# Patient Record
Sex: Female | Born: 1985 | Race: White | Hispanic: No | Marital: Single | Smoking: Current every day smoker
Health system: Southern US, Community
[De-identification: ages and names within clinical notes are randomized; demographics above are authoritative.]

## PROBLEM LIST (undated history)

## (undated) DIAGNOSIS — F419 Anxiety disorder, unspecified: Secondary | ICD-10-CM

## (undated) DIAGNOSIS — R011 Cardiac murmur, unspecified: Secondary | ICD-10-CM

## (undated) HISTORY — DX: Cardiac murmur, unspecified: R01.1

## (undated) HISTORY — PX: WISDOM TOOTH EXTRACTION: SHX21

## (undated) HISTORY — DX: Anxiety disorder, unspecified: F41.9

---
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
## 2006-06-05 ENCOUNTER — Other Ambulatory Visit: Admission: RE | Admit: 2006-06-05 | Discharge: 2006-06-05 | Payer: Self-pay | Admitting: Family Medicine

## 2007-11-08 ENCOUNTER — Other Ambulatory Visit: Admission: RE | Admit: 2007-11-08 | Discharge: 2007-11-08 | Payer: Self-pay | Admitting: Family Medicine

## 2008-11-26 ENCOUNTER — Other Ambulatory Visit: Admission: RE | Admit: 2008-11-26 | Discharge: 2008-11-26 | Payer: Self-pay | Admitting: Family Medicine

## 2009-01-15 ENCOUNTER — Encounter: Admission: RE | Admit: 2009-01-15 | Discharge: 2009-01-15 | Payer: Self-pay | Admitting: Occupational Medicine

## 2009-06-21 ENCOUNTER — Encounter: Admission: RE | Admit: 2009-06-21 | Discharge: 2009-06-21 | Payer: Self-pay | Admitting: Unknown Physician Specialty

## 2010-01-04 ENCOUNTER — Other Ambulatory Visit: Admission: RE | Admit: 2010-01-04 | Discharge: 2010-01-04 | Payer: Self-pay | Admitting: Family Medicine

## 2010-06-22 ENCOUNTER — Emergency Department (HOSPITAL_COMMUNITY): Admission: EM | Admit: 2010-06-22 | Discharge: 2010-06-22 | Payer: Self-pay | Admitting: Emergency Medicine

## 2011-01-25 ENCOUNTER — Other Ambulatory Visit (HOSPITAL_COMMUNITY)
Admission: RE | Admit: 2011-01-25 | Discharge: 2011-01-25 | Disposition: A | Discharge status: Home / Self Care | Payer: 59 | Source: Ambulatory Visit | Attending: Family Medicine | Admitting: Family Medicine

## 2011-01-25 DIAGNOSIS — Z124 Encounter for screening for malignant neoplasm of cervix: Secondary | ICD-10-CM | POA: Insufficient documentation

## 2012-06-17 ENCOUNTER — Emergency Department (HOSPITAL_COMMUNITY): Payer: PRIVATE HEALTH INSURANCE

## 2012-06-17 ENCOUNTER — Encounter (HOSPITAL_COMMUNITY): Payer: Self-pay | Admitting: *Deleted

## 2012-06-17 ENCOUNTER — Emergency Department (HOSPITAL_COMMUNITY)
Admission: EM | Admit: 2012-06-17 | Discharge: 2012-06-17 | Disposition: A | Discharge status: Home / Self Care | Payer: PRIVATE HEALTH INSURANCE | Attending: Emergency Medicine | Admitting: Emergency Medicine

## 2012-06-17 DIAGNOSIS — Z880 Allergy status to penicillin: Secondary | ICD-10-CM | POA: Insufficient documentation

## 2012-06-17 DIAGNOSIS — M25519 Pain in unspecified shoulder: Secondary | ICD-10-CM | POA: Insufficient documentation

## 2012-06-17 DIAGNOSIS — F17200 Nicotine dependence, unspecified, uncomplicated: Secondary | ICD-10-CM | POA: Insufficient documentation

## 2012-06-17 MED ORDER — HYDROCODONE-ACETAMINOPHEN 5-325 MG PO TABS: ORAL_TABLET | ORAL | Status: AC

## 2012-06-17 MED ORDER — NAPROXEN 500 MG PO TABS: 500.0000 mg | ORAL_TABLET | Freq: Two times a day (BID) | ORAL | Status: AC

## 2012-06-17 NOTE — ED Notes (Signed)
Pt reports having right shoulder pain, was at work when pain started, denies any known injury to ext.

## 2012-06-17 NOTE — ED Provider Notes (Signed)
History     CSN: [id number]  Arrival date & time 06/17/12  1522   First MD Initiated Contact with Patient 06/17/12 1606      Chief Complaint  Patient presents with  . Shoulder Pain    (Consider location/radiation/quality/duration/timing/severity/associated sxs/prior treatment) HPI Comments: Patient c/o pain and "popping" sensation to her right shoulder.  States she was at work and noticed her shoulder began "popping" when she moved it or picked up something.  Also c/o intermittent "tingling" sensation into her right arm.  She denies recent injury, neck pain, chest pain, shortness of breath or swelling.    Patient is a 26 y.o. female presenting with shoulder pain. The history is provided by the patient.  Shoulder Pain This is a new problem. Episode onset: several hours PTA. The problem occurs constantly. The problem has been unchanged. Associated symptoms include arthralgias and joint swelling. Pertinent negatives include no chest pain, chills, coughing, diaphoresis, fever, headaches, neck pain, numbness, visual change, vomiting or weakness. Associated symptoms comments: Tingling to her right arm. The symptoms are aggravated by bending (use, and movement). She has tried nothing for the symptoms. The treatment provided no relief.    History reviewed. No pertinent past medical history.  History reviewed. No pertinent past surgical history.  History reviewed. No pertinent family history.  History  Substance Use Topics  . Smoking status: Current Everyday Smoker  . Smokeless tobacco: Not on file  . Alcohol Use: No    OB History    Grav Para Term Preterm Abortions TAB SAB Ect Mult Living                  Review of Systems  Constitutional: Negative for fever, chills and diaphoresis.  HENT: Negative for neck pain and neck stiffness.   Respiratory: Negative for cough and chest tightness.   Cardiovascular: Negative for chest pain.  Gastrointestinal: Negative for vomiting.    Genitourinary: Negative for dysuria and difficulty urinating.  Musculoskeletal: Positive for joint swelling and arthralgias. Negative for back pain.  Skin: Negative for color change and wound.  Neurological: Negative for dizziness, weakness, numbness and headaches.  All other systems reviewed and are negative.    Allergies  Penicillins  Home Medications   Current Outpatient Rx  Name Route Sig Dispense Refill  . ASPIRIN-ACETAMINOPHEN-CAFFEINE 250-250-65 MG PO TABS Oral Take 2 tablets by mouth as needed. For pain    . NORETHINDRONE ACET-ETHINYL EST 1-20 MG-MCG PO TABS Oral Take 1 tablet by mouth every morning.      BP 142/76  Pulse 107  Temp 98.3 F (36.8 C) (Oral)  Resp 18  Ht 5' 7.5" (1.715 m)  Wt 155 lb (70.308 kg)  BMI 23.92 kg/m2  SpO2 98%  LMP 05/22/2012  Physical Exam  Nursing note and vitals reviewed. Constitutional: She is oriented to person, place, and time. She appears well-developed and well-nourished. No distress.  HENT:  Head: Normocephalic and atraumatic.  Neck: Normal range of motion. Neck supple. No spinous process tenderness and no muscular tenderness present. No rigidity. Normal range of motion present.  Cardiovascular: Normal rate, regular rhythm, normal heart sounds and intact distal pulses.   No murmur heard. Pulmonary/Chest: Effort normal and breath sounds normal. She exhibits no tenderness.  Musculoskeletal: She exhibits tenderness. She exhibits no edema.       Right shoulder: She exhibits tenderness, crepitus and pain. She exhibits normal range of motion, no swelling, no effusion, no deformity, no laceration, no spasm, normal pulse and normal strength.  Arms:      Radial pulse is brisk, sensation intact.  CR< 2 sec.  No bruising or deformity.  Patient has full ROM of the shoulder, elbow and neck.  Mild crepitus is present with abduction of the arm.    Lymphadenopathy:    She has no cervical adenopathy.  Neurological: She is alert and oriented  to person, place, and time. She exhibits normal muscle tone. Coordination normal.  Skin: Skin is warm and dry.    ED Course  Procedures (including critical care time)  Labs Reviewed - No data to display Dg Shoulder Right  06/17/2012  *RADIOLOGY REPORT*  Clinical Data: Shoulder pain.  No injury.  RIGHT SHOULDER - 2+ VIEW  Comparison: None.  Findings: Cervical rib on the right is incidentally noted. Visualized right chest appears normal.  Internal and external rotation views of the right shoulder are normal.  The AC joint is normal. Right shoulder is located.  IMPRESSION: Negative.  Original Report Authenticated By: Andreas Newport, M.D.    Sling applied by the nursing staff, pain improved,  Remains NV intact    MDM    Consulted Dr. Romeo Apple.  Recommends sling and close f/u with his office.  Patient agrees to care plan and verbalized understanding   The patient appears reasonably screened and/or stabilized for discharge and I doubt any other medical condition or other [facility] requiring further screening, evaluation, or treatment in the ED at this time prior to discharge.   Prescribed: norco #[id number] naprosyn       Uva Runkel L. Berkley Wrightsman, [place] 06/20/12 1610

## 2012-06-17 NOTE — ED Notes (Signed)
Rt shoulder pain, "feels like its popping in and out" Tingling at times.  No known injury

## 2012-06-21 NOTE — ED Provider Notes (Signed)
Medical screening examination/treatment/procedure(s) were performed by non-physician practitioner and as supervising physician I was immediately available for consultation/collaboration.  Athira Janowicz L Liliani Bobo, MD 06/21/12 0935 

## 2012-07-03 ENCOUNTER — Ambulatory Visit: Payer: PRIVATE HEALTH INSURANCE | Admitting: *Deleted

## 2012-07-10 ENCOUNTER — Ambulatory Visit (HOSPITAL_COMMUNITY)
Admission: RE | Admit: 2012-07-10 | Discharge: 2012-07-10 | Disposition: A | Discharge status: Home / Self Care | Payer: PRIVATE HEALTH INSURANCE | Source: Ambulatory Visit | Attending: Family Medicine | Admitting: Family Medicine

## 2012-07-10 DIAGNOSIS — IMO0001 Reserved for inherently not codable concepts without codable children: Secondary | ICD-10-CM | POA: Insufficient documentation

## 2012-07-10 DIAGNOSIS — M6281 Muscle weakness (generalized): Secondary | ICD-10-CM | POA: Insufficient documentation

## 2012-07-10 DIAGNOSIS — M25519 Pain in unspecified shoulder: Secondary | ICD-10-CM | POA: Insufficient documentation

## 2012-07-10 DIAGNOSIS — M7541 Impingement syndrome of right shoulder: Secondary | ICD-10-CM | POA: Insufficient documentation

## 2012-07-10 NOTE — Evaluation (Signed)
Occupational Therapy Evaluation  Patient Details  Name: Dana May MRN: [id number] Date of Birth: Oct 16, 1986  Today's Date: 07/10/2012 Time: 1525-1600 OT Time Calculation (min): 35 min OT Eval 15 Manual Therapy 15 Visit#: 1  of 12   Re-eval: 08/07/12  Assessment Diagnosis: RIght Shoulder Impingement Syndrome Next MD Visit: one month  Authorization: Workers Comp 12 visits  Authorization Time Period:    Authorization Visit#:   of     Past Medical History: No past medical history on file. Past Surgical History: No past surgical history on file.  Subjective S:  I was doing my normal routine at work on 06/17/12 and noticed alot of pain, popping, and tingling in my right shoulder and arm. Pertinent History: Ms. Dana May was working at the [employer] on 06/17/12 when she began to feel popping, pain, and tingling in her right shoulder region.  She consulted with occupational  health and was treated for shoulder impingement.  She followed up with Dr. Theresia Lo on 06/25/12 and recieved a cortisone injection.  She has been referred to occupational therapy for evaluation and treatment for 12 visits. Special Tests: UEFI 60/80 = 75% Patient Stated Goals: I want to use my arm without it feeling like this. 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 Leisure: Hobbies-yes (Comment)  Assessment ADL/Vision/Perception ADL ADL Comments: Ms. Dana May is having difficulty maintaining her grip on plates when she puts them into cabinets.  She has difficulty  maintaing her grip on patient's gait belts.  She was not able to squeeze the nozzle on the gas pump with her right hand.  She is not able to reach overhead comfortably. Dominant Hand: Right Vision - History Baseline Vision: No visual deficits Perception Perception: Within Functional Limits Praxis Praxis: Intact  Cognition/Observation Cognition Overall Cognitive Status: Appears within functional limits for tasks assessed  Sensation/Coordination/Edema Sensation Light Touch: Appears Intact Coordination Gross Motor Movements are Fluid and Coordinated: Yes Fine Motor Movements are Fluid and Coordinated: Yes Edema Edema: N/A  Additional Assessments RUE AROM (degrees) RUE Overall AROM Comments: Assessed in seated.  ER/IR with shoulder abducted Right Shoulder Flexion: 155 Degrees Right Shoulder ABduction: 155 Degrees Right Shoulder Internal Rotation: 75 Degrees Right Shoulder External Rotation: 80 Degrees RUE PROM (degrees) RUE Overall PROM Comments: WFL RUE Strength Right Shoulder Flexion: 5/5 Right Shoulder ABduction: 5/5 Right Shoulder Internal Rotation: 5/5 Right Shoulder External Rotation: 5/5 Grip (lbs): Right 56 pounds left 80 pounds Palpation Palpation: Moderate fascial restriction in right scapular region and upper arm.     Exercise/Treatments    Manual Therapy Manual Therapy: Myofascial release Myofascial Release: MFR and manual stretching to right shoulder to decrease pain and restrictions in her right shoulder region and promote pain free AROM.  Occupational Therapy Assessment and Plan OT Assessment and Plan Clinical Impression Statement: A:  Patient presents with increased pain and restrictions and decreased AROM and grip strength in her right arm.  Skilled OT is indicated to improve on these deficts for return to full use of her RUE with desired activities.   Pt will benefit from skilled therapeutic intervention in order to improve on the following deficits:  Decreased range of motion;Decreased strength;Increased fascial restricitons;Increased muscle spasms;Pain Rehab Potential: Excellent OT Frequency: Min 2X/week OT Duration: 6 weeks OT Treatment/Interventions: Self-care/ADL training;Therapeutic exercise;Therapeutic activities;Manual therapy;Patient/family education OT Plan:  Skilled OT is indicated to improve on these deficts for return to full use of her RUE with desired activities.  Teratment Plan:  MFR to right scapular region and upper arm.  Ther Ex for scapular stability and strengthening.     Goals Short Term Goals Time to Complete Short Term Goals: 3 weeks Short Term Goal 1: Patient will be educated on a HEP. Short Term Goal 2: Patient will increase right shoulder AROM by 10 for increased ability to reach overhead. Short Term Goal 3: Patient will increase right grip strength by 10 pounds for increased ability to maintain grasp on gait belts at work. 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 Short Term Goal 5: Patient will decrease fascial restrictions to minimal in her right shoulder. Long Term Goals Time to Complete Long Term Goals: 6 weeks Long Term Goal 1: Patient will return to prior level of I with all B/IADLs, work, and leisure activities. Long Term Goal 2: Patient will increase her right shoulder AROM to WNL for increased ability to reach into overhead cabinets. Long Term Goal 3: Patient will increase her right grip strength to 75 pounds or more for increased ability to squeeze the nozzle on gas pumps. Long Term Goal 4: Patient will decrease her pain level to 1/10 in her right shoulder region.   Long Term Goal 5: Patient will decrease fascial restrictions to trace in her right shoulder. Additional Long Term Goals?: Yes Long Term Goal 6: Patient will increase right scapular stability to normal.   Problem List Patient Active Problem List  Diagnosis  . Impingement  syndrome of right shoulder  . Pain in joint, shoulder region  . Muscle weakness (generalized)    End of Session Activity Tolerance: Patient tolerated treatment well General Behavior During Session: [facility] for tasks performed Cognition: [facility] for tasks performed OT Plan of Care OT Home Exercise Plan: tband for scapular stability and shoulder stretches.  Shirlean Mylar, OTR/L  07/10/2012, 5:16 PM  Physician Documentation Your signature is required to indicate approval of the treatment plan as stated above.  Please sign and either send electronically or make a copy of this report for your files and return this physician signed original.  Please mark one 1.__approve of plan  2. ___approve of plan with the following conditions.   ______________________________                                                          _____________________ Physician Signature  Date  

## 2012-07-17 ENCOUNTER — Ambulatory Visit (HOSPITAL_COMMUNITY)
Admission: RE | Admit: 2012-07-17 | Discharge: 2012-07-17 | Disposition: A | Discharge status: Home / Self Care | Payer: PRIVATE HEALTH INSURANCE | Source: Ambulatory Visit | Attending: Family Medicine | Admitting: Family Medicine

## 2012-07-17 DIAGNOSIS — M7541 Impingement syndrome of right shoulder: Secondary | ICD-10-CM

## 2012-07-17 DIAGNOSIS — IMO0001 Reserved for inherently not codable concepts without codable children: Secondary | ICD-10-CM | POA: Insufficient documentation

## 2012-07-17 DIAGNOSIS — M25619 Stiffness of unspecified shoulder, not elsewhere classified: Secondary | ICD-10-CM | POA: Insufficient documentation

## 2012-07-17 DIAGNOSIS — M25519 Pain in unspecified shoulder: Secondary | ICD-10-CM | POA: Insufficient documentation

## 2012-07-17 DIAGNOSIS — M6281 Muscle weakness (generalized): Secondary | ICD-10-CM | POA: Insufficient documentation

## 2012-07-17 NOTE — Progress Notes (Addendum)
Occupational Therapy Treatment Patient Details  Name: Dana May MRN: [id number] Date of Birth: [birth year]  Today's Date: 07/17/2012 Time: 4098-1191 OT Time Calculation (min): 56 min Manual Therapy: 478-295  33' Therapeutic Exercise: 349-411 23'  Visit#: 2  of 12   Re-eval: 08/07/12    Authorization: Workers Comp 12 visits  Authorization Time Period:    Authorization Visit#:   of    Subjective Symptoms/Limitations Symptoms: S:  My arm does not hurt but it tingles all the way down and I drop stuff. Pain Assessment Currently in Pain?: No/denies Pain Score: 0-No pain  Precautions/Restrictions   Shoulder Precautions  Exercise/Treatments Supine Protraction: PROM;AAROM;10 reps Horizontal ABduction: PROM;AAROM;10 reps External Rotation: PROM;AAROM;10 reps Internal Rotation: PROM;AAROM;10 reps Flexion: PROM;AAROM;10 reps ABduction: PROM;AAROM;10 reps    Standing External Rotation: Strengthening;15 reps Theraband Level (Shoulder External Rotation): Level 3 (Green) Internal Rotation: Strengthening;15 reps Theraband Level (Shoulder Internal Rotation): Level 3 (Green) Extension: Strengthening;15 reps Theraband Level (Shoulder Extension): Level 3 (Green) Row: Strengthening;15 reps Theraband Level (Shoulder Row): Level 3 (Green) Retraction: Strengthening;15 reps Theraband Level (Shoulder Retraction): Level 3 (Green)    Therapy Ball Flexion: 25 reps ABduction: 25 reps ROM / Strengthening / Isometric Strengthening UBE (Upper Arm Bike): 3' forward and backward 1.5       Manual Therapy Manual Therapy: Myofascial release Myofascial Release: MFR and manual stretching to right shoulder to decrease pain and restrictions in her right shoulder region and promote pain free AROM  Occupational Therapy Assessment and Plan OT Assessment and Plan Clinical Impression Statement: A: Patient showed full PROM and AROM today. Patient tolerated strengthening exercises well. Required  minimal tactile and vg cues to keep proper scapular positioning during therapy ball ABD Rehab Potential: Excellent OT Plan: P: Add V to X, and W exercises for increased ROM and strengthening. Transition from supine AAROM to prone ex for scapular strengthening. Add ball circles to therapy ball exercises.   Goals Short Term Goals Time to Complete Short Term Goals: 3 weeks Short Term Goal 1: Patient will be educated on a HEP. Short Term Goal 2: Patient will increase right shoulder AROM by 10 for increased ability to reach overhead. Short Term Goal 3: Patient will increase right grip strength by 10 pounds for increased ability to maintain grasp on gait belts at work. 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 Short Term Goal 5: Patient will decrease fascial restrictions to minimal in her right shoulder. Long Term Goals Time to Complete Long Term Goals: 6 weeks Long Term Goal 1: Patient will return to prior level of I with all B/IADLs, work, and leisure activities. Long Term Goal 2: Patient will increase her right shoulder AROM to WNL for increased ability to reach into overhead cabinets. Long Term Goal 3: Patient will increase her right grip strength to 75 pounds or more for increased ability to squeeze the nozzle on gas pumps. Long Term Goal 4: Patient will decrease her pain level to 1/10 in her right shoulder region.   Long Term Goal 5: Patient will decrease fascial restrictions to trace in her right shoulder. Additional Long Term Goals?: Yes Long Term Goal 6: Patient will increase right scapular stability to normal.   Problem List Patient Active Problem List  Diagnosis  . Impingement syndrome of right shoulder  . Pain in joint, shoulder region  . Muscle weakness (generalized)     GO   [name], OTAS  07/17/2012, 4:49 PM  Note reviewed by clinical instructor and accurately reflects treatment session.  Mariann Palo L.  Noralee Stain, COTA/L

## 2012-07-19 ENCOUNTER — Ambulatory Visit (HOSPITAL_COMMUNITY): Payer: Self-pay | Admitting: Occupational Therapy

## 2012-07-23 ENCOUNTER — Ambulatory Visit (HOSPITAL_COMMUNITY)
Admission: RE | Admit: 2012-07-23 | Discharge: 2012-07-23 | Disposition: A | Discharge status: Home / Self Care | Payer: PRIVATE HEALTH INSURANCE | Source: Ambulatory Visit | Attending: Family Medicine | Admitting: Family Medicine

## 2012-07-23 DIAGNOSIS — M7541 Impingement syndrome of right shoulder: Secondary | ICD-10-CM

## 2012-07-23 DIAGNOSIS — M6281 Muscle weakness (generalized): Secondary | ICD-10-CM

## 2012-07-23 DIAGNOSIS — M25519 Pain in unspecified shoulder: Secondary | ICD-10-CM

## 2012-07-23 NOTE — Progress Notes (Signed)
Note reviewed by clinical instructor and accurately reflects treatment session.  Nicolaas Savo L. Latronda Spink, COTA/L  

## 2012-07-23 NOTE — Progress Notes (Signed)
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 Pain Assessment Currently in Pain?: Yes Pain Score:   4 Pain Location: Shoulder  Precautions/Restrictions     Exercise/Treatments Supine Protraction: PROM;10 reps Horizontal ABduction: PROM;10 reps External Rotation: PROM;10 reps Internal Rotation: PROM;10 reps Flexion: PROM;10 reps ABduction: PROM;10 reps  Prone  Retraction: AROM;10 reps Flexion: AROM;10 reps Extension: AROM;10 reps External Rotation: AROM;10 reps Internal Rotation: AROM;10 reps Horizontal ABduction 1: AROM;10 reps Horizontal ABduction 2: AROM;10 reps    Standing External Rotation: Strengthening;20 reps Theraband Level (Shoulder External Rotation): Level 3 (Green) Internal Rotation: Strengthening;20 reps Theraband Level (Shoulder Internal Rotation): Level 3 (Green) Flexion: Strengthening;20 reps Theraband Level (Shoulder Flexion): Level 3 (Green) ABduction: Strengthening;20 reps Theraband Level (Shoulder ABduction): Level 3 (Green) Extension: Strengthening;20 reps Theraband Level (Shoulder Extension): Level 3 (Green) Row: Strengthening;20 reps Theraband Level (Shoulder Row): Level 3 (Green) Retraction: Strengthening;20 reps Theraband Level (Shoulder Retraction): Level 3 (Green)    Therapy Ball Flexion: 25 reps ABduction: 25 reps Right/Left: 5 reps ROM / Strengthening / Isometric Strengthening UBE (Upper Arm Bike): 3' forward and backward 1.5 "W" Arms: 12 reps X to V  Arms: 12 reps           Manual Therapy Manual Therapy: Myofascial release Myofascial Release: MFR and manual stretching to right shoulder, scapula and upper trap to decrease pain and restrictions to promote pain free AROM to allow to be pain free with work and leisure skills. Completed by Theophilus Bones, COTA/L  [phone]   Occupational Therapy Assessment and Plan OT Assessment and Plan Clinical Impression Statement: A: Pt showed improved scapula positioning on therapy ball exercises. Added x to v, and w exercises, patient tolerated well.  Increased thband reps. Patient tolerated well and with correct form. Discharge thband to HEP.  Clinical Impairments Affecting Rehab Potential:  P: Add proximal stability exercises. Add ball on wall, thumb tacks, and elevation/depression/ retraction for increased strength and endurance.     Goals Short Term Goals Time to Complete Short Term Goals: 3 weeks Short Term Goal 1: Patient will be educated on a HEP. Short Term Goal 1 Progress: Met Short Term Goal 2: Patient will increase right shoulder AROM by 10 for increased ability to reach overhead. Short Term Goal 2 Progress: Met Short Term Goal 3: Patient will increase right grip strength by 10 pounds for increased ability to maintain grasp on gait belts at work. 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 Short Term Goal 4 Progress: Met Short Term Goal 5: Patient will decrease fascial restrictions to minimal in her right shoulder. Short Term Goal 5 Progress: Progressing toward goal Long Term Goals Time to Complete Long Term Goals: 6 weeks Long Term Goal 1: Patient will return to prior level of I with all B/IADLs, work, and leisure activities. Long Term Goal 1 Progress: Progressing toward goal Long Term Goal 2: Patient will increase her  right shoulder AROM to WNL for increased ability to  reach into overhead cabinets. Long Term Goal 2 Progress: Met Long Term Goal 3: Patient will increase her right grip strength to 75 pounds or more for increased ability to squeeze the nozzle on gas pumps. Long Term Goal 4: Patient will decrease her pain level to 1/10 in her right shoulder region.   Long Term Goal 4 Progress: Progressing toward goal Long Term Goal 5: Patient will decrease fascial restrictions to trace in her right shoulder. Long Term Goal 5 Progress: Progressing toward goal Additional Long Term Goals?: Yes Long Term Goal 6: Patient will increase right scapular stability to normal.  Long Term Goal 6 Progress: Progressing toward goal  Problem List Patient Active Problem List  Diagnosis  . Impingement syndrome of right shoulder  . Pain in joint, shoulder region  . Muscle weakness (generalized)    End of Session Activity Tolerance: Patient tolerated treatment well General Behavior During Session: [facility] for tasks performed Cognition: [facility] for tasks performed     [name], OTAS  07/23/2012, 5:04 PM

## 2012-07-25 ENCOUNTER — Ambulatory Visit (HOSPITAL_COMMUNITY)
Admission: RE | Admit: 2012-07-25 | Discharge: 2012-07-25 | Disposition: A | Discharge status: Home / Self Care | Payer: PRIVATE HEALTH INSURANCE | Source: Ambulatory Visit | Attending: Family Medicine | Admitting: Family Medicine

## 2012-07-25 DIAGNOSIS — M25519 Pain in unspecified shoulder: Secondary | ICD-10-CM

## 2012-07-25 DIAGNOSIS — M7541 Impingement syndrome of right shoulder: Secondary | ICD-10-CM

## 2012-07-25 DIAGNOSIS — M6281 Muscle weakness (generalized): Secondary | ICD-10-CM

## 2012-07-25 NOTE — Progress Notes (Signed)
Occupational Therapy Treatment Patient Details  Name: Dana May MRN: [id number] Date of Birth: 1986-08-06  Today's Date: 07/25/2012 Time: 4098-1191 OT Time Calculation (min): 51 min Manual Therapy: 4782-9562 21' Therapeutic Exercise: 1308-6578 30'  Visit#: 4  of 12   Re-eval: 08/07/12    Authorization: Workers Comp 12 visits  Authorization Time Period:    Authorization Visit#:   of    Subjective Symptoms/Limitations Symptoms: S: I can't get comfortable when I sleep.  Pain Assessment Currently in Pain?: Yes Pain Score:   2 Pain Location: Shoulder Pain Orientation: Right;Anterior;Lateral Pain Type: Acute pain  Precautions/Restrictions   n/a  Exercise/Treatments Supine Protraction: PROM;10 reps Horizontal ABduction: PROM;10 reps External Rotation: PROM;10 reps Internal Rotation: PROM;10 reps Flexion: PROM;10 reps ABduction: PROM;10 reps    Prone  Retraction: AROM;12 reps Flexion: AROM;12 reps Extension: AROM;12 reps External Rotation: AROM;12 reps Internal Rotation: AROM;12 reps Horizontal ABduction 1: AROM;12 reps Horizontal ABduction 2: AROM;12 reps       Therapy Ball Flexion: 25 reps ABduction: 25 reps Right/Left: 5 reps ROM / Strengthening / Isometric Strengthening UBE (Upper Arm Bike): 3' forward 3' backward 3.5 "W" Arms: 12 reps (2# wt) X to V Arms: 12 reps (2# wt) Proximal Shoulder Strengthening, Seated: 3x10 w/ 2# Ball on Wall: 1' Prot/Ret//Elev/Dep: 1' Other ROM/Strengthening Exercises: scapular depression with 2# wt         Manual Therapy Manual Therapy: Myofascial release Myofascial Release: MFR and manual stretching to right shoulder, scapula and upper trap to decrease pain and restrictions to promote pain free AROM to allow to be pain free with work and leisure skills  Occupational Therapy Assessment and Plan OT Assessment and Plan Clinical Impression Statement: A: Added wt to all seated exercises performed; patient tolerated  well; became fatigued by the end of seated exercises using 2# wt. Added wall exercises; ball on wall and elev/dep/ret/pro. Pt required min tactile cues to perform dep/ret exercise Rehab Potential: Excellent  OT Plan: P: Increase reps in seated exercises. Increase time on wall exercises. Provide pt with visual print outs for muscle structure to explain benefits of therapeutic exercises to decrease pain and increase ROM and ADL performance. taken at 07/25/12 1624 by [name], Student-OT   Goals Short Term Goals Time to Complete Short Term Goals: 3 weeks Short Term Goal 1: Patient will be educated on a HEP. Short Term Goal 2: Patient will increase right shoulder AROM by 10 for increased ability to reach overhead. Short Term Goal 3: Patient will increase right grip strength by 10 pounds for increased ability to maintain grasp on gait belts at work. 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 Short Term Goal 5: Patient will decrease fascial restrictions to minimal in her right shoulder. Long Term Goals Time to Complete Long Term Goals: 6 weeks Long Term Goal 1: Patient will return to prior level of I with all B/IADLs, work, and leisure activities. Long Term Goal 2: Patient will increase her right shoulder AROM to WNL for increased ability to reach into overhead cabinets. Long Term Goal 3: Patient will increase her right grip strength to 75 pounds or more for increased ability to squeeze the nozzle on gas pumps. Long Term Goal 4: Patient will decrease her pain level to 1/10 in her right shoulder region.   Long Term Goal 5: Patient will decrease fascial restrictions to trace in her right shoulder. Additional Long Term Goals?: Yes Long Term Goal 6:  Patient will increase right scapular stability to normal.   Problem List Patient Active Problem List  Diagnosis  . Impingement syndrome of right shoulder  . Pain in joint, shoulder  region  . Muscle weakness (generalized)    End of Session Activity Tolerance: Patient tolerated treatment well General Behavior During Session: [facility] for tasks performed Cognition: [facility] for tasks performed  GO   [name], OTAS  07/25/2012, 4:36 PM

## 2012-07-25 NOTE — Progress Notes (Signed)
Note reviewed by clinical instructor and accurately reflects treatment session.  Bethany H. Murray, OTR/L  

## 2012-07-31 ENCOUNTER — Ambulatory Visit (HOSPITAL_COMMUNITY)
Admission: RE | Admit: 2012-07-31 | Discharge: 2012-07-31 | Disposition: A | Discharge status: Home / Self Care | Payer: PRIVATE HEALTH INSURANCE | Source: Ambulatory Visit | Attending: Family Medicine | Admitting: Family Medicine

## 2012-07-31 DIAGNOSIS — M25519 Pain in unspecified shoulder: Secondary | ICD-10-CM

## 2012-07-31 DIAGNOSIS — M6281 Muscle weakness (generalized): Secondary | ICD-10-CM

## 2012-07-31 DIAGNOSIS — M7541 Impingement syndrome of right shoulder: Secondary | ICD-10-CM

## 2012-07-31 NOTE — Progress Notes (Signed)
Occupational Therapy Treatment Patient Details  Name: Dana May MRN: [id number] Date of Birth: Jul 30, 1986  Today's Date: 07/31/2012 Time: 4098-1191 OT Time Calculation (min): 44 min Manual Therapy 478-295 17' Therapeutic Exercise 204-230 26'  Visit#: 5  of 12   Re-eval: 08/07/12    Authorization: Workers Comp 12 visits    Subjective Symptoms/Limitations Symptoms: S:  Since the weather has changed i feel tight at night. Pain Assessment Currently in Pain?: No/denies Pain Score: 0-No pain  Precautions/Restrictions     Exercise/Treatments Supine Protraction: PROM;10 reps Horizontal ABduction: PROM;10 reps External Rotation: PROM;10 reps Internal Rotation: PROM;10 reps Flexion: PROM;10 reps ABduction: PROM;10 reps Seated Protraction: Strengthening;10 reps Horizontal ABduction: Strengthening;10 reps External Rotation: Strengthening;10 reps Internal Rotation: Strengthening;10 reps Flexion: Strengthening;10 reps Abduction: Strengthening;10 reps Prone  Retraction: AROM;15 reps Flexion: AROM;15 reps Extension: AROM;15 reps External Rotation: AROM;15 reps Internal Rotation: AROM;15 reps Horizontal ABduction 1: AROM;15 reps Horizontal ABduction 2: AROM;15 reps Therapy Ball Flexion:  (d/c) ABduction:  (d/c) Right/Left: 5 reps ROM / Strengthening / Isometric Strengthening UBE (Upper Arm Bike): 3' forward 3' backward 4.0 "W" Arms: 15 reps with 2# X to V Arms: 15 reps with 2# Proximal Shoulder Strengthening, Seated: x 20 with no weight or rests Ball on Wall: 2' with green ball Prot/Ret//Elev/Dep: 1' Other ROM/Strengthening Exercises: scapular depression with green band       Manual Therapy Manual Therapy: Myofascial release Myofascial Release: MFR and manual stretching to right shoulder, scapula and upper trap to decrease pain and restrictions to promote pain free AROM to allow to be pain free with work and leisure skills  Occupational Therapy Assessment and  Plan OT Assessment and Plan Clinical Impression Statement: A:  Added seated strengthening which fatigued patient but she completed without any increase in pain. Also add scapular depression with green band to increase scapular stability. Rehab Potential: Excellent OT Plan: P:  Increase reps with seated strengthening.  Attempt wt's with prone ex to increase scapular strength.   Goals Short Term Goals Time to Complete Short Term Goals: 3 weeks Short Term Goal 1: Patient will be educated on a HEP. Short Term Goal 2: Patient will increase right shoulder AROM by 10 for increased ability to reach overhead. Short Term Goal 3: Patient will increase right grip strength by 10 pounds for increased ability to maintain grasp on gait belts at work. 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 Short Term Goal 5: Patient will decrease fascial restrictions to minimal in her right shoulder. Long Term Goals Time to Complete Long Term Goals: 6 weeks Long Term Goal 1: Patient will return to prior level of I with all B/IADLs, work, and leisure activities. Long Term Goal 2: Patient will increase her right shoulder AROM to WNL for increased ability to reach into overhead cabinets. Long Term Goal 3: Patient will increase her right grip strength to 75 pounds or more for increased ability to squeeze the nozzle on gas pumps. Long Term Goal 4: Patient will decrease her pain level to 1/10 in her right shoulder region.   Long Term Goal 5: Patient will decrease fascial restrictions to trace in her right shoulder. Additional Long Term Goals?: Yes Long Term Goal 6: Patient will increase right scapular stability to normal.   Problem List Patient Active Problem List  Diagnosis  . Impingement syndrome of right shoulder  . Pain in joint, shoulder region  . Muscle weakness (generalized)    End  of Session Activity Tolerance: Patient tolerated treatment  well General Behavior During Session: [facility] for tasks performed Cognition: [facility] for tasks performed  GO    Marquise Lambson L. Cortana Vanderford, COTA/L  07/31/2012, 4:08 PM

## 2012-08-02 ENCOUNTER — Ambulatory Visit (HOSPITAL_COMMUNITY)
Admission: RE | Admit: 2012-08-02 | Discharge: 2012-08-02 | Disposition: A | Discharge status: Home / Self Care | Payer: PRIVATE HEALTH INSURANCE | Source: Ambulatory Visit | Attending: Family Medicine | Admitting: Family Medicine

## 2012-08-02 DIAGNOSIS — M6281 Muscle weakness (generalized): Secondary | ICD-10-CM

## 2012-08-02 DIAGNOSIS — M7541 Impingement syndrome of right shoulder: Secondary | ICD-10-CM

## 2012-08-02 DIAGNOSIS — M25519 Pain in unspecified shoulder: Secondary | ICD-10-CM

## 2012-08-02 NOTE — Progress Notes (Signed)
Occupational Therapy Treatment Patient Details  Name: Dana May MRN: [id number] Date of Birth: 1986/06/14  Today's Date: 08/02/2012 Time: 4098-1191 OT Time Calculation (min): 49 min Manual Therapy: 236-254 18' Therapeutic Exercise: 254-225 31'  Visit#: 6  of 12   Re-eval: 08/07/12    Authorization: Workers Comp 12 visits  Authorization Time Period:    Authorization Visit#:   of    Subjective Symptoms/Limitations Symptoms: S: Im not in any pain at all. I havent done anything all week, Ive been on vacation from work.  Pain Assessment Pain Score: 0-No pain  Precautions/Restrictions   Shoulder  Exercise/Treatments Supine Protraction: PROM;10 reps Horizontal ABduction: PROM;10 reps External Rotation: PROM;10 reps Internal Rotation: PROM;10 reps Flexion: PROM;10 reps ABduction: PROM;10 reps    Prone  Retraction: Strengthening;10 reps (2#) Flexion: Strengthening;10 reps (2#) Extension: Strengthening;10 reps (2#) External Rotation: Strengthening;10 reps (2#) Internal Rotation: Strengthening;10 reps (2#) Horizontal ABduction 1: Strengthening;10 reps (2#) Horizontal ABduction 2: Strengthening;10 reps (2#) Therapy Ball Right/Left: 10 reps ROM / Strengthening / Isometric Strengthening UBE (Upper Arm Bike): 3' forward 3' backward 5.0 "W" Arms: 20 with 2# X to V Arms: 20 with 2# Proximal Shoulder Strengthening, Seated: 3 x10 with 1# wt Ball on Wall: 2' flexion with green ball 2' abd w/gr ball Graduated Retraction with Theraband: x3 with green band Sustained Retraction with Theraband: x3 with green band  Other ROM/Strengthening Exercises: scapular depression with green band x20          Manual Therapy Manual Therapy: Myofascial release Myofascial Release: MFR and manual stretching to right shoulder, scapula and upper trap to decrease pain and restrictions to promote pain free AROM to allow to be pain free with work and leisure skills   Occupational Therapy  Assessment and Plan OT Assessment and Plan Clinical Impression Statement: A: Pt had full ROM and less restrictions. Pt stated that due to her not working in a few days she feels like the pain is gone. Pt toleratd added reps and wt to seated exercises. Added sustained thband and graduated thband. Pt tolerated well.  Clinical Impairments Affecting Rehab Potential: P: Discuss proper body mechanics while working if restrictions and fatigue have increase due to workload. Add work simulation to discuss and educate on Psychiatrist and energy concervation. Attempt  prone ball exercises to strengthen and stabilize scapula.    Goals Short Term Goals Time to Complete Short Term Goals: 3 weeks Short Term Goal 1: Patient will be educated on a HEP. Short Term Goal 2: Patient will increase right shoulder AROM by 10 for increased ability to reach overhead. Short Term Goal 3: Patient will increase right grip strength by 10 pounds for increased ability to maintain grasp on gait belts at work. 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 Short Term Goal 5: Patient will decrease fascial restrictions to minimal in her right shoulder. Long Term Goals Time to Complete Long Term Goals: 6 weeks Long Term Goal 1: Patient will return to prior level of I with all B/IADLs, work, and leisure activities. Long Term Goal 2: Patient will increase her right shoulder AROM to WNL for increased ability to reach into overhead cabinets. Long Term Goal 3: Patient will increase her right grip strength to 75 pounds or more for increased ability to squeeze the nozzle on gas pumps. Long Term Goal 4: Patient will decrease her pain level to 1/10 in her right shoulder region.   Long Term  Goal 5: Patient will decrease fascial restrictions to trace in her right shoulder. Additional Long Term Goals?: Yes Long Term Goal 6: Patient will increase right scapular stability to  normal.   Problem List Patient Active Problem List  Diagnosis  . Impingement syndrome of right shoulder  . Pain in joint, shoulder region  . Muscle weakness (generalized)    End of Session Activity Tolerance: Patient tolerated treatment well General Behavior During Session: [facility] for tasks performed Cognition: [facility] for tasks performed  GO    Noralee Stain, Mannie Ohlin L 08/02/2012, 3:33 PM

## 2012-08-06 ENCOUNTER — Ambulatory Visit (HOSPITAL_COMMUNITY)
Admission: RE | Admit: 2012-08-06 | Discharge: 2012-08-06 | Disposition: A | Discharge status: Home / Self Care | Payer: PRIVATE HEALTH INSURANCE | Source: Ambulatory Visit | Attending: Family Medicine | Admitting: Family Medicine

## 2012-08-06 DIAGNOSIS — M6281 Muscle weakness (generalized): Secondary | ICD-10-CM

## 2012-08-06 DIAGNOSIS — M25519 Pain in unspecified shoulder: Secondary | ICD-10-CM

## 2012-08-06 DIAGNOSIS — M7541 Impingement syndrome of right shoulder: Secondary | ICD-10-CM

## 2012-08-06 NOTE — Progress Notes (Signed)
Occupational Therapy Treatment Patient Details  Name: Dana May MRN: [id number] Date of Birth: 1986-03-20  Today's Date: 08/06/2012 Time: 4098-1191 OT Time Calculation (min): 10 min Reassessment 10'  Visit#: 7  of 12   Re-eval: 08/07/12    Authorization: Workers Comp 12 visits   Subjective S:  I am doing much better.  I have some tightness between my shoulder blades, but thats it.  Special Tests: UEFI was 75%, currently is 99% Pain Assessment Currently in Pain?: No/denies Pain Score: 0-No pain Precautions/Restrictions   N/A  Exercise/Treatments Reassessment completed this date.      Occupational Therapy Assessment and Plan OT Assessment and Plan Clinical Impression Statement: A:  Please refer to dc summary.  Patient has met all OT goals, is satisfied with her current functional level and is dc from OT services.   OT Plan: P:  DC this date.     Goals Short Term Goals Time to Complete Short Term Goals: 3 weeks Short Term Goal 1: Patient will be educated on a HEP. Short Term Goal 1 Progress: Met Short Term Goal 2: Patient will increase right shoulder AROM by 10 for increased ability to reach overhead. Short Term Goal 2 Progress: Met Short Term Goal 3: Patient will increase right grip strength by 10 pounds for increased ability to maintain grasp on gait belts at work. Short Term Goal 3 Progress: Met Short Term Goal 4: Patient will decrease pain to 3/10 in her right shoulder region while assisting patients. Short Term Goal 4 Progress: Met Short Term Goal 5: Patient will decrease fascial restrictions to minimal in her right shoulder. Short Term Goal 5 Progress: Met Long Term Goals Time to Complete Long Term Goals: 6 weeks Long Term Goal 1: Patient will return to prior level of I with all B/IADLs, work, and leisure activities. Long Term Goal 1 Progress: Met Long Term Goal 2: Patient will increase her right shoulder AROM to WNL for increased ability to reach into overhead  cabinets. Long Term Goal 2 Progress: Met Long Term Goal 3: Patient will increase her right grip strength to 75 pounds or more for increased ability to squeeze the nozzle on gas pumps. Long Term Goal 3 Progress: Met Long Term Goal 4: Patient will decrease her pain level to 1/10 in her right shoulder region.   Long Term Goal 4 Progress: Met Long Term Goal 5: Patient will decrease fascial restrictions to trace in her right shoulder. Long Term Goal 5 Progress: Met Additional Long Term Goals?: Yes Long Term Goal 6: Patient will increase right scapular stability to normal.  Long Term Goal 6 Progress: Met  Problem List Patient Active Problem List  Diagnosis  . Impingement syndrome of right shoulder  . Pain in joint, shoulder region  . Muscle weakness (generalized)    End of Session Activity Tolerance: Patient tolerated treatment well General Behavior During Session: [facility] for tasks performed Cognition: [facility] for tasks performed OT Plan of Care OT Home Exercise Plan: Reviewed HEP.   GO    Shirlean Mylar, OTR/L  08/06/2012, 4:11 PM

## 2012-08-09 ENCOUNTER — Ambulatory Visit (HOSPITAL_COMMUNITY): Payer: Self-pay | Admitting: Occupational Therapy

## 2012-08-13 ENCOUNTER — Ambulatory Visit (HOSPITAL_COMMUNITY): Payer: Self-pay | Admitting: Occupational Therapy

## 2012-08-16 ENCOUNTER — Ambulatory Visit (HOSPITAL_COMMUNITY): Payer: Self-pay | Admitting: Specialist

## 2012-08-21 ENCOUNTER — Ambulatory Visit (HOSPITAL_COMMUNITY): Payer: Self-pay | Admitting: Specialist

## 2014-07-12 IMAGING — CR DG SHOULDER 2+V*R*
3 series · 3 of 3 positions shown · non-contrast
Comparison: None.

CLINICAL DATA: Shoulder pain.  No injury.

RIGHT SHOULDER - 2+ VIEW

[view not recorded (1 of 3)]
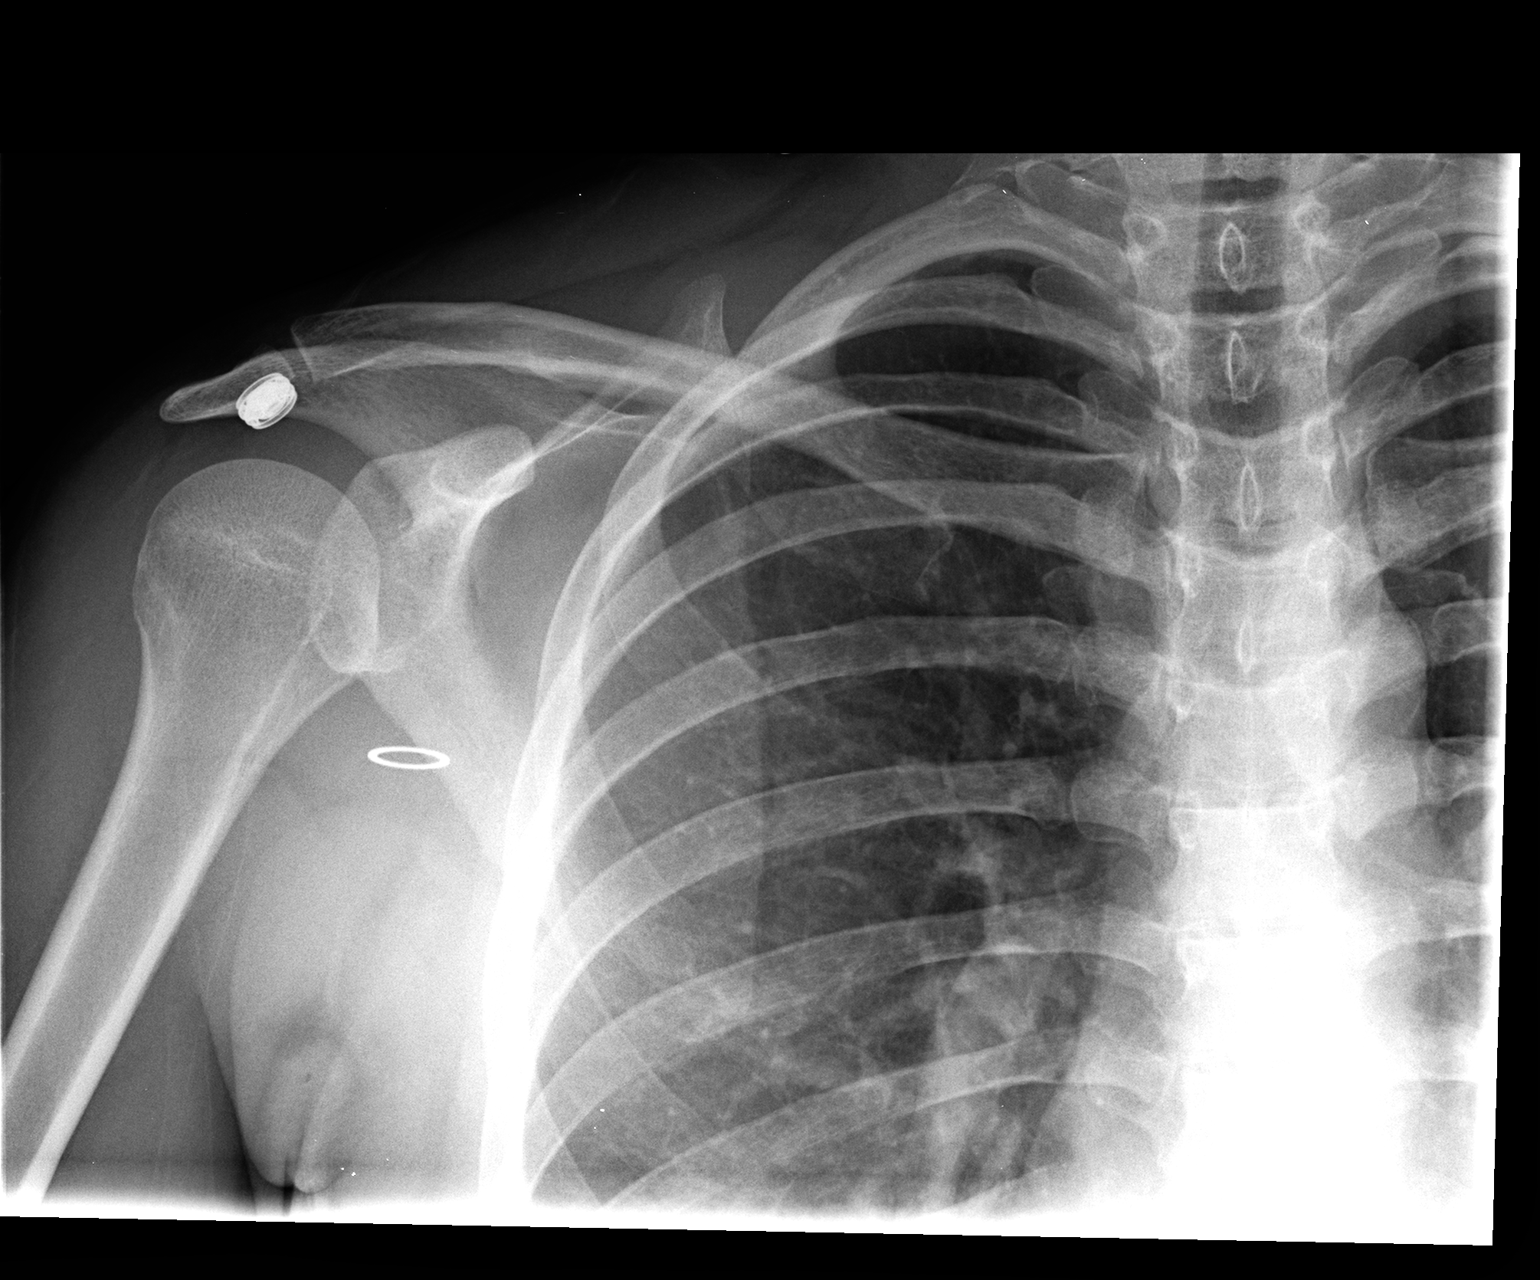

[view not recorded (2 of 3)]
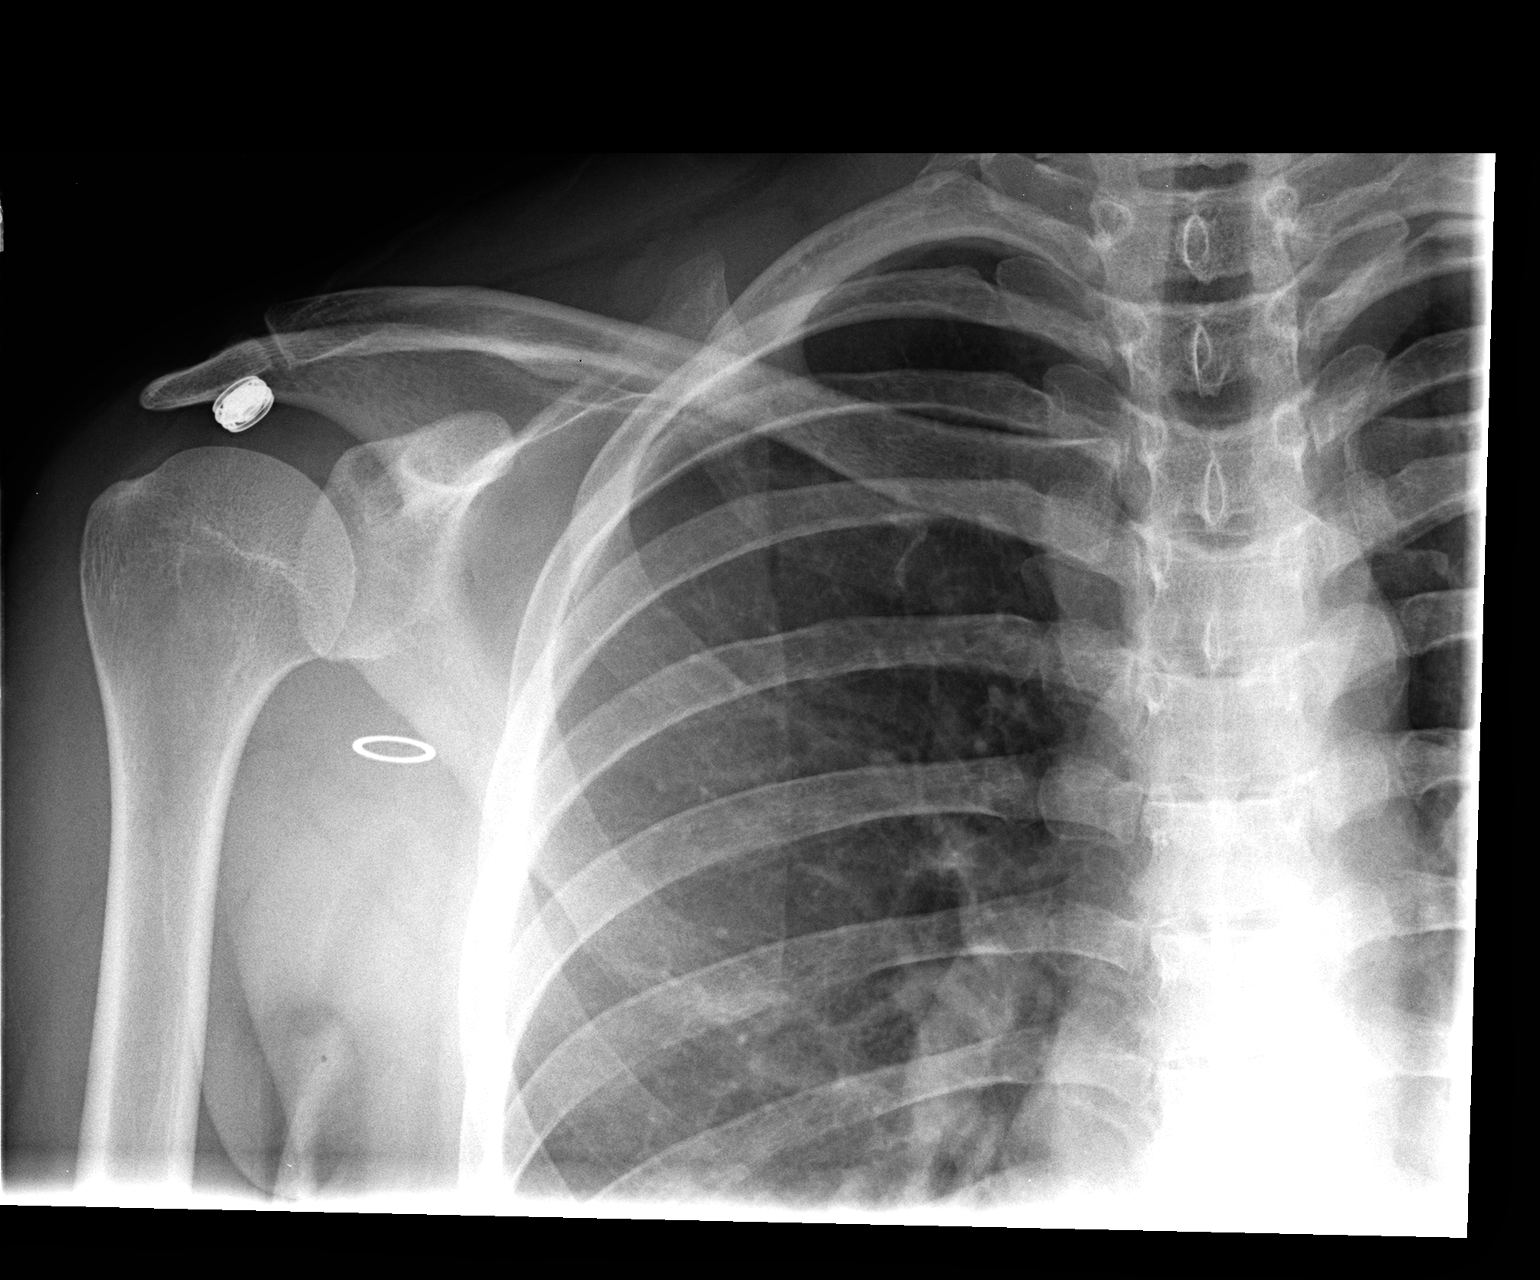

[view not recorded (3 of 3)]
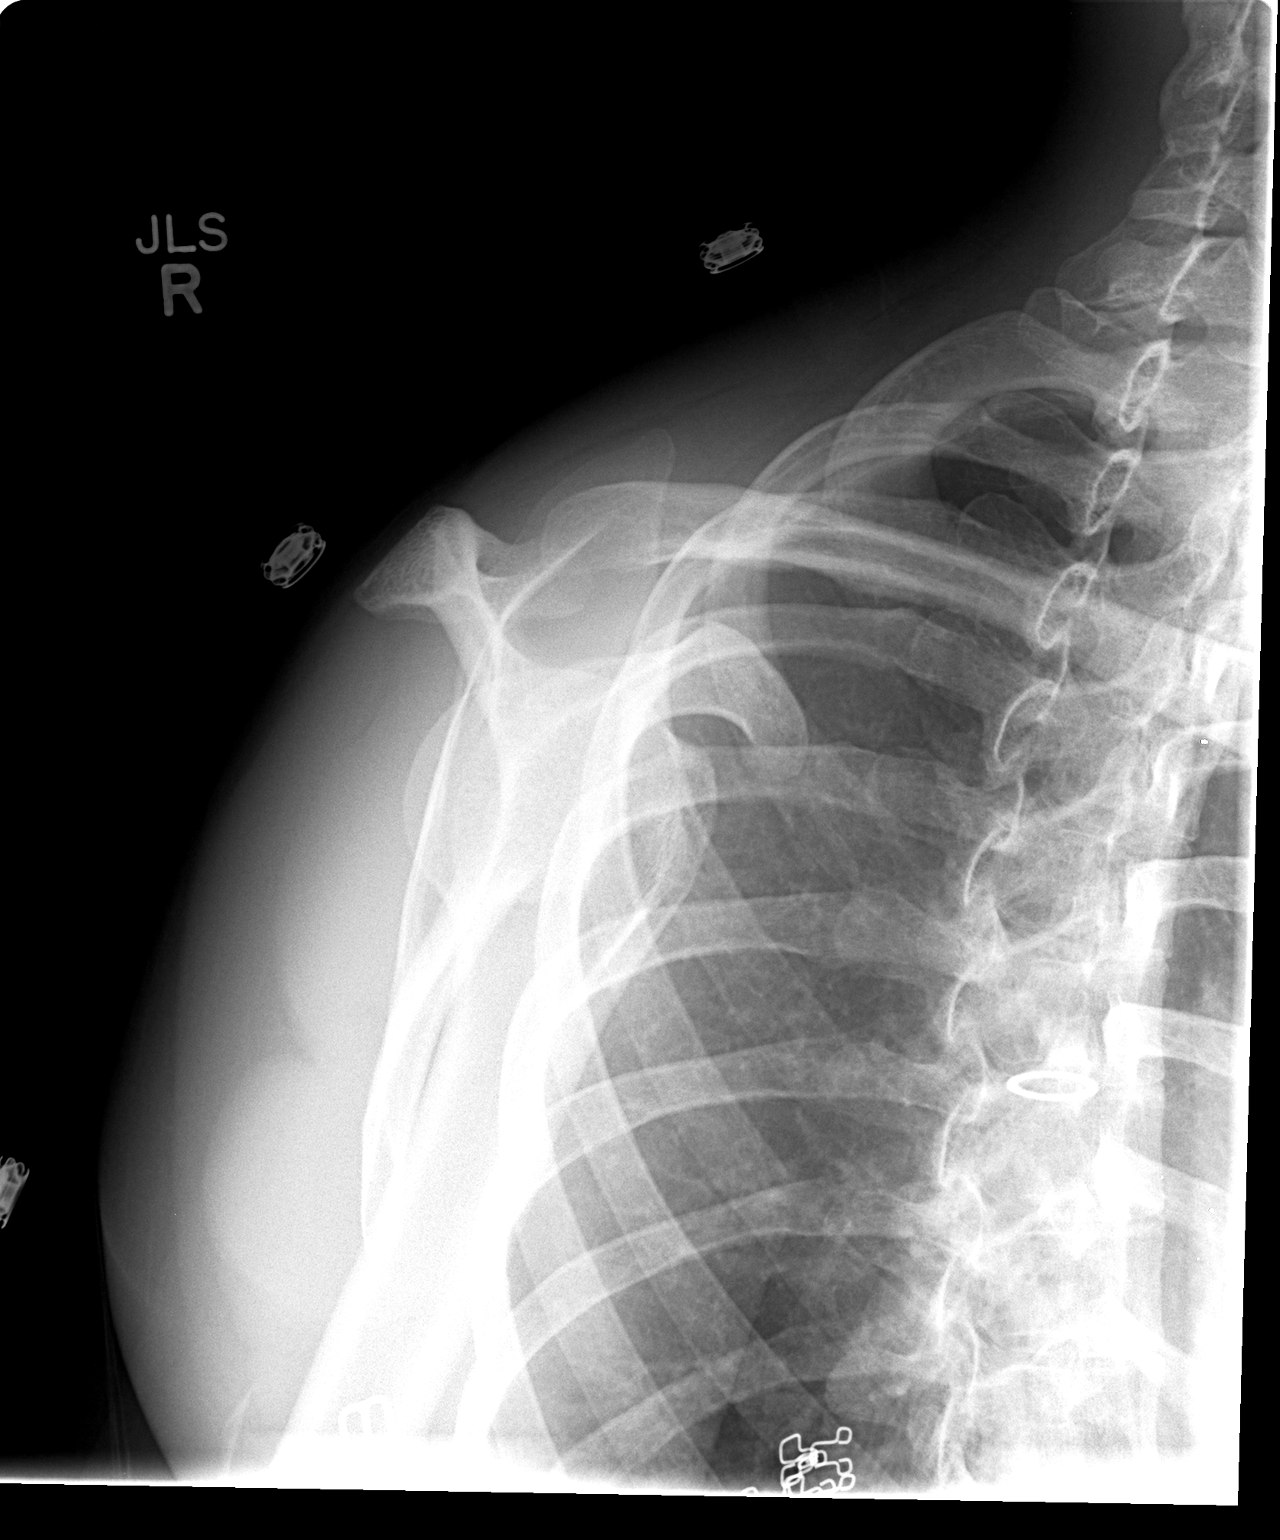

[3 of 3 positions shown; findings below may reference images not displayed]

FINDINGS: Cervical rib on the right is incidentally noted.
Visualized right chest appears normal.  Internal and external
rotation views of the right shoulder are normal.  The AC joint is
normal. Right shoulder is located.
IMPRESSION: Negative.

## 2015-01-21 DIAGNOSIS — F32A Depression, unspecified: Secondary | ICD-10-CM | POA: Insufficient documentation

## 2018-04-06 ENCOUNTER — Other Ambulatory Visit: Payer: Self-pay

## 2018-04-06 ENCOUNTER — Emergency Department
Admission: EM | Admit: 2018-04-06 | Discharge: 2018-04-06 | Disposition: A | Discharge status: Home / Self Care | Payer: No Typology Code available for payment source | Attending: Emergency Medicine | Admitting: Emergency Medicine

## 2018-04-06 ENCOUNTER — Encounter: Payer: Self-pay | Admitting: Emergency Medicine

## 2018-04-06 DIAGNOSIS — Z23 Encounter for immunization: Secondary | ICD-10-CM | POA: Diagnosis not present

## 2018-04-06 DIAGNOSIS — Z79899 Other long term (current) drug therapy: Secondary | ICD-10-CM | POA: Diagnosis not present

## 2018-04-06 DIAGNOSIS — Y939 Activity, unspecified: Secondary | ICD-10-CM | POA: Diagnosis not present

## 2018-04-06 DIAGNOSIS — Y990 Civilian activity done for income or pay: Secondary | ICD-10-CM | POA: Diagnosis not present

## 2018-04-06 DIAGNOSIS — F17210 Nicotine dependence, cigarettes, uncomplicated: Secondary | ICD-10-CM | POA: Insufficient documentation

## 2018-04-06 DIAGNOSIS — Y9229 Other specified public building as the place of occurrence of the external cause: Secondary | ICD-10-CM | POA: Diagnosis not present

## 2018-04-06 DIAGNOSIS — S60371A Other superficial bite of right thumb, initial encounter: Secondary | ICD-10-CM | POA: Insufficient documentation

## 2018-04-06 DIAGNOSIS — S6991XA Unspecified injury of right wrist, hand and finger(s), initial encounter: Secondary | ICD-10-CM | POA: Diagnosis present

## 2018-04-06 DIAGNOSIS — W540XXA Bitten by dog, initial encounter: Secondary | ICD-10-CM | POA: Diagnosis not present

## 2018-04-06 MED ORDER — TETANUS-DIPHTH-ACELL PERTUSSIS 5-2.5-18.5 LF-MCG/0.5 IM SUSP
0.5000 mL | Freq: Once | INTRAMUSCULAR | Status: AC
  Administered 2018-04-06: 0.5 mL via INTRAMUSCULAR
  Filled 2018-04-06: qty 0.5

## 2018-04-06 MED ORDER — DOXYCYCLINE HYCLATE 100 MG PO CAPS: 100.0000 mg | ORAL_CAPSULE | Freq: Two times a day (BID) | ORAL | 0 refills | Status: DC

## 2018-04-06 NOTE — ED Provider Notes (Signed)
[facility] Emergency Department Provider Note  ____________________________________________   First MD Initiated Contact with Patient 04/06/18 1027     (approximate)  I have reviewed the triage vital signs and the nursing notes.   HISTORY  Chief Complaint Animal Bite    HPI Dana May is a 32 y.o. female presents emergency department complaining of a dog bite to the right thumb.  She states she was at work holding  the [place] that bit her.  The dog will be in custody of the owner and will be able to be observed.  Sheriff's department was notified via animal control.  She then later realized that the dogs rabies shot was out of date.  She did get the rabies vaccine for her physical.  The dog has not shown any signs of rabies.  It is her normal habit to bite people she does not know.  She was not foaming at the mouth or acting abnormal.  History reviewed. No pertinent past medical history.  Patient Active Problem List   Diagnosis Date Noted  . Impingement syndrome of right shoulder 07/10/2012  . Pain in joint, shoulder region 07/10/2012  . Muscle weakness (generalized) 07/10/2012    History reviewed. No pertinent surgical history.  Prior to Admission medications   Medication Sig Start Date End Date Taking? Authorizing Provider  aspirin-acetaminophen-caffeine (EXCEDRIN MIGRAINE) [id number] MG per tablet Take 2 tablets by mouth as needed. For pain    [provider]  doxycycline (VIBRAMYCIN) 100 MG capsule Take 1 capsule (100 mg total) by mouth 2 (two) times daily. 04/06/18   Sherrie Mustache Roselyn Bering, PA-C  norethindrone-ethinyl estradiol (JUNEL 1/20) 1-20 MG-MCG tablet Take 1 tablet by mouth every morning.    [provider]    Allergies Penicillins  History reviewed. No pertinent family history.  Social History Social History   Tobacco Use  . Smoking status: Current Every Day Smoker  . Smokeless tobacco: Never Used  Substance  Use Topics  . Alcohol use: No  . Drug use: No    Review of Systems  Constitutional: No fever/chills Eyes: No visual changes. ENT: No sore throat. Respiratory: Denies cough Genitourinary: Negative for dysuria. Musculoskeletal: Negative for back pain. Skin: Negative for rash.  Positive for dog bite    ____________________________________________   PHYSICAL EXAM:  VITAL SIGNS: ED Triage Vitals  Enc Vitals Group     BP 04/06/18 1022 131/68     Pulse Rate 04/06/18 1022 91     Resp 04/06/18 1022 16     Temp 04/06/18 1022 98.6 F (37 C)     Temp Source 04/06/18 1022 Oral     SpO2 04/06/18 1022 100 %     Weight 04/06/18 1019 155 lb (70.3 kg)     Height 04/06/18 1019  (1.702 m)     Head Circumference --      Peak Flow --      Pain Score 04/06/18 1018 2     Pain Loc --      Pain Edu? --      Excl. in GC? --     Constitutional: Alert and oriented. Well appearing and in no acute distress. Eyes: Conjunctivae are normal.  Head: Atraumatic. Nose: No congestion/rhinnorhea. Mouth/Throat: Mucous membranes are moist.   Cardiovascular: Normal rate, regular rhythm. Respiratory: Normal respiratory effort.  No retractions GU: deferred Musculoskeletal: FROM all extremities, warm and well perfused.  The thumb is not tender to palpation Neurologic:  Normal speech and  language.  Skin:  Skin is warm, dry .  There are 2 very dry already healing bite marks on the right thumb.  There is no redness or swelling noted at this time. Psychiatric: Mood and affect are normal. Speech and behavior are normal.  ____________________________________________   LABS (all labs ordered are listed, but only abnormal results are displayed)  Labs Reviewed - No data to display ____________________________________________   ____________________________________________  RADIOLOGY    ____________________________________________   PROCEDURES  Procedure(s) performed:  Tdap  Procedures    ____________________________________________   INITIAL IMPRESSION / ASSESSMENT AND PLAN / ED COURSE  Pertinent labs & imaging results that were available during my care of the patient were reviewed by me and considered in my medical decision making (see chart for details).  Patient is 32 year old female presents emergency department complaining of a dog bite to the right thumb.  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  The dog bit her thumb she dropped which while in 1 about her day.  They are concerned because the dog's rabies vaccine was not up-to-date when she came into the clinic.  However the dog had not been acting abnormally.  Animal control was notified by the vet's office they also called the sheriff's department  On physical exam there is a small tiny bite to the right thumb.  There is no active bleeding or oozing.  There is no redness noted at site.  She is neurovascularly intact.  Explained to the patient that since the dog will be able to be monitored for the next 10 days we will not start rabies vaccines today.  She states she understands.  She was given a tetanus vaccine and an antibiotic for infection prevention.  She is to return to the emergency department if the dog shows any signs of rabies.  She states she understands will comply with these instructions.  She was discharged in stable condition     As part of my medical decision making, I reviewed the following data within the electronic MEDICAL RECORD NUMBER Nursing notes reviewed and incorporated, Notes from prior ED visits and [place] Controlled Substance Database  ____________________________________________   FINAL CLINICAL IMPRESSION(S) / ED DIAGNOSES  Final diagnoses:  Dog bite, initial encounter      NEW MEDICATIONS STARTED DURING THIS VISIT:  New Prescriptions   DOXYCYCLINE (VIBRAMYCIN) 100 MG CAPSULE    Take 1 capsule (100 mg  total) by mouth 2 (two) times daily.     Note:  This document was prepared using Dragon voice recognition software and may include unintentional dictation errors.    Faythe Ghee, PA-C 04/06/18 1105    Emily Filbert, MD 04/06/18 [phone]

## 2018-04-06 NOTE — ED Notes (Signed)
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

## 2018-04-06 NOTE — ED Triage Notes (Signed)
Pt to ed after being bit by dog this am.  Pt works for a [employer], [employer] and states the dogs rabies vaccination are not up date. Right thumb mildly red and swollen.

## 2018-04-06 NOTE — Discharge Instructions (Addendum)
Return to the emergency department if the dog starts to show any signs of rabies.  You will need the rabies vaccine if the dog does become rabid.  Take the antibiotic to prevent infection from a dog bite.  Clean the area with soap and water.

## 2018-11-13 NOTE — L&D Delivery Note (Signed)
Delivery Note At 7:14 AM a viable female was delivered via Vaginal, Spontaneous (Presentation: vtx;  ).  APGAR: , ; weight  .   Placenta status:intact  , .  Cord:  with the following complications: .  Cord pH:   Anesthesia: cle  Episiotomy: None Lacerations: bilateral sulcus tear , bilateral labial tears  Suture Repair: 2.0 3.0 vicryl Est. Blood Loss (mL):  QBL= 1740 cc Mother pushed well and delivered head and shoulders without difficulty  . While waiting for delivery of placenta pt developed significant bleeding . Pressure applied to sulcus tears .Once placenta delivered IM pitocin was given after some delay because of a non functional IV . IM methergine  As well as 250 mcg Hemabate . TXA then called for . Good tone of utx . LAcerations then repaired . 1 unit of emergent blood was called for and pt is A+ and blood bank sent O+ . Bleeding ultimately controlled . BP did become hypotensive 60s/ 30 s transiently and came up once working IV was placed . Second IV line subsequently placed  Good hemostasis at end .  Pt will be transfused one unit of blood . Place foley catheter. Mom to postpartum.  Baby to Couplet care / Skin to Skin.  Gwen Her Schermerhorn 10/16/2019, 8:39 AM

## 2019-02-13 ENCOUNTER — Other Ambulatory Visit (HOSPITAL_COMMUNITY): Payer: Self-pay | Admitting: Family Medicine

## 2019-02-13 LAB — PREGNANCY, URINE: Preg Test, Ur: POSITIVE — AB

## 2019-09-23 LAB — OB RESULTS CONSOLE GBS: GBS: NEGATIVE

## 2019-10-15 ENCOUNTER — Other Ambulatory Visit: Payer: Self-pay

## 2019-10-15 ENCOUNTER — Inpatient Hospital Stay
Admission: EM | Admit: 2019-10-15 | Discharge: 2019-10-18 | DRG: 806 | Disposition: A | Discharge status: Home / Self Care | Payer: Medicaid Other | Attending: Obstetrics and Gynecology | Admitting: Obstetrics and Gynecology

## 2019-10-15 DIAGNOSIS — Z3A39 39 weeks gestation of pregnancy: Secondary | ICD-10-CM

## 2019-10-15 DIAGNOSIS — Z0371 Encounter for suspected problem with amniotic cavity and membrane ruled out: Secondary | ICD-10-CM | POA: Diagnosis present

## 2019-10-15 DIAGNOSIS — O99334 Smoking (tobacco) complicating childbirth: Secondary | ICD-10-CM | POA: Diagnosis present

## 2019-10-15 DIAGNOSIS — Z20828 Contact with and (suspected) exposure to other viral communicable diseases: Secondary | ICD-10-CM | POA: Diagnosis present

## 2019-10-15 DIAGNOSIS — O4292 Full-term premature rupture of membranes, unspecified as to length of time between rupture and onset of labor: Principal | ICD-10-CM | POA: Diagnosis present

## 2019-10-15 DIAGNOSIS — O26893 Other specified pregnancy related conditions, third trimester: Secondary | ICD-10-CM | POA: Diagnosis present

## 2019-10-15 DIAGNOSIS — O4290 Premature rupture of membranes, unspecified as to length of time between rupture and onset of labor, unspecified weeks of gestation: Secondary | ICD-10-CM | POA: Diagnosis present

## 2019-10-15 LAB — CBC
HCT: 35.1 % — ABNORMAL LOW (ref 36.0–46.0)
Hemoglobin: 12.5 g/dL (ref 12.0–15.0)
MCH: 34.2 pg — ABNORMAL HIGH (ref 26.0–34.0)
MCHC: 35.6 g/dL (ref 30.0–36.0)
MCV: 95.9 fL (ref 80.0–100.0)
Platelets: 215 10*3/uL (ref 150–400)
RBC: 3.66 MIL/uL — ABNORMAL LOW (ref 3.87–5.11)
RDW: 12.7 % (ref 11.5–15.5)
WBC: 13.5 10*3/uL — ABNORMAL HIGH (ref 4.0–10.5)
nRBC: 0 % (ref 0.0–0.2)

## 2019-10-15 LAB — RUPTURE OF MEMBRANE (ROM)PLUS: Rom Plus: POSITIVE

## 2019-10-15 LAB — SARS CORONAVIRUS 2 BY RT PCR (HOSPITAL ORDER, PERFORMED IN ~~LOC~~ HOSPITAL LAB): SARS Coronavirus 2: NEGATIVE

## 2019-10-15 MED ORDER — BUTORPHANOL TARTRATE 1 MG/ML IJ SOLN
1.0000 mg | INTRAMUSCULAR | Status: DC | PRN
  Administered 2019-10-15 – 2019-10-16 (×2): 1 mg via INTRAVENOUS
  Filled 2019-10-15 (×2): qty 1

## 2019-10-15 MED ORDER — ONDANSETRON HCL 4 MG/2ML IJ SOLN
4.0000 mg | Freq: Four times a day (QID) | INTRAMUSCULAR | Status: DC | PRN
  Administered 2019-10-16 (×2): 4 mg via INTRAVENOUS
  Filled 2019-10-15 (×2): qty 2

## 2019-10-15 MED ORDER — SOD CITRATE-CITRIC ACID 500-334 MG/5ML PO SOLN: 30.0000 mL | ORAL | Status: DC | PRN

## 2019-10-15 MED ORDER — LACTATED RINGERS IV SOLN
500.0000 mL | INTRAVENOUS | Status: DC | PRN
  Administered 2019-10-15: 500 mL via INTRAVENOUS

## 2019-10-15 MED ORDER — LIDOCAINE HCL (PF) 1 % IJ SOLN
INTRAMUSCULAR | Status: AC
  Filled 2019-10-15: qty 30

## 2019-10-15 MED ORDER — OXYTOCIN BOLUS FROM INFUSION
500.0000 mL | Freq: Once | INTRAVENOUS | Status: AC
  Administered 2019-10-16: 500 mL via INTRAVENOUS

## 2019-10-15 MED ORDER — OXYTOCIN 10 UNIT/ML IJ SOLN
INTRAMUSCULAR | Status: AC
  Filled 2019-10-15: qty 2

## 2019-10-15 MED ORDER — TERBUTALINE SULFATE 1 MG/ML IJ SOLN: 0.2500 mg | Freq: Once | INTRAMUSCULAR | Status: DC | PRN

## 2019-10-15 MED ORDER — LACTATED RINGERS IV SOLN
INTRAVENOUS | Status: DC
  Administered 2019-10-15 (×2): via INTRAVENOUS

## 2019-10-15 MED ORDER — OXYCODONE-ACETAMINOPHEN 5-325 MG PO TABS: 2.0000 | ORAL_TABLET | ORAL | Status: DC | PRN

## 2019-10-15 MED ORDER — OXYCODONE-ACETAMINOPHEN 5-325 MG PO TABS: 1.0000 | ORAL_TABLET | ORAL | Status: DC | PRN

## 2019-10-15 MED ORDER — MISOPROSTOL 200 MCG PO TABS
ORAL_TABLET | ORAL | Status: AC
  Filled 2019-10-15: qty 4

## 2019-10-15 MED ORDER — LIDOCAINE HCL (PF) 1 % IJ SOLN
30.0000 mL | INTRAMUSCULAR | Status: AC | PRN
  Administered 2019-10-16: 30 mL via SUBCUTANEOUS

## 2019-10-15 MED ORDER — ACETAMINOPHEN 325 MG PO TABS: 650.0000 mg | ORAL_TABLET | ORAL | Status: DC | PRN

## 2019-10-15 MED ORDER — OXYTOCIN 40 UNITS IN NORMAL SALINE INFUSION - SIMPLE MED
2.5000 [IU]/h | INTRAVENOUS | Status: DC
  Administered 2019-10-16 (×2): 2.5 [IU]/h via INTRAVENOUS
  Filled 2019-10-15: qty 1000

## 2019-10-15 MED ORDER — AMMONIA AROMATIC IN INHA
RESPIRATORY_TRACT | Status: AC
  Filled 2019-10-15: qty 10

## 2019-10-15 MED ORDER — OXYTOCIN 40 UNITS IN NORMAL SALINE INFUSION - SIMPLE MED
1.0000 m[IU]/min | INTRAVENOUS | Status: DC
  Administered 2019-10-15: 4 m[IU]/min via INTRAVENOUS
  Filled 2019-10-15 (×2): qty 1000

## 2019-10-15 NOTE — Progress Notes (Signed)
Dana May is a 33 y.o. female. She is at [redacted]w[redacted]d gestation. Patient's last menstrual period was 01/14/2019 (exact date). Estimated Date of Delivery: 10/21/19 S; pt c/o LOF since midnight  , no vaginal bleeding  Ctx worsening  Prenatal care site: [facility] Location: Onset/timing: Duration: Quality:  Severity: Aggravating or alleviating conditions: Associated signs/symptoms: Context:    Maternal Medical History:  History reviewed. No pertinent past medical history.  Past Surgical History:  Procedure Laterality Date  . WISDOM TOOTH EXTRACTION      Allergies  Allergen Reactions  . Penicillins Hives, Swelling and Rash    Prior to Admission medications   Medication Sig Start Date End Date Taking? Authorizing Provider  Prenatal Vit-Fe Fumarate-FA (MULTIVITAMIN-PRENATAL) 27-0.8 MG TABS tablet Take 1 tablet by mouth daily at 12 noon.   Yes [provider]  aspirin-acetaminophen-caffeine (EXCEDRIN MIGRAINE) [phone] MG per tablet Take 2 tablets by mouth as needed. For pain    [provider]  doxycycline (VIBRAMYCIN) 100 MG capsule Take 1 capsule (100 mg total) by mouth 2 (two) times daily. Patient not taking: Reported on 10/15/2019 04/06/18   Versie Starks, PA-C  norethindrone-ethinyl estradiol (JUNEL 1/20) 1-20 MG-MCG tablet Take 1 tablet by mouth every morning.    [provider]     Social History: She  reports that she has been smoking. She has never used smokeless tobacco. She reports that she does not drink alcohol or use drugs.  Family History: family history is not on file.  no history of gyn cancers  Review of Systems: A full review of systems was performed and negative except as noted in the HPI.    Review of Systems: A full review of systems was performed and negative except as noted in the HPI.   Eyes: no vision change  Ears: left ear pain  Oropharynx: no sore throat  Pulmonary . No shortness of breath , no  hemoptysis Cardiovascular: no chest pain , no irregular heart beat  Gastrointestinal:no blood in stool . No diarrhea, no constipation Uro gynecologic: no dysuria , no pelvic pain Neurologic : no seizure , no migraines    Musculoskeletal: no muscular weakness  O:  BP 122/77 (BP Location: Right Arm)   Pulse 74   Temp 98.1 F (36.7 C) (Oral)   Resp 16   Ht 5\' 7"  (1.702 m)   Wt 82.1 kg   LMP 01/14/2019 (Exact Date)   BMI 28.35 kg/m  No results found for this or any previous visit (from the past 11 hour(s)).   Constitutional: NAD, AAOx3  HE/ENT: extraocular movements grossly intact, moist mucous membranes CV: RRR PULM: nl respiratory effort, CTABL     Abd: gravid, non-tender, non-distended, soft      Ext: Non-tender, Nonedmeatous   Psych: mood appropriate, speech normal Pelvic by RM  Closed . Neg fern   ROM + = positive   NST: reactive Baseline: 120 Variability: moderate Accelerations present x >2 Decelerations absent CTX q 3 min  Time 77mins    Assessment: 33 y.o. [redacted]w[redacted]d here with SROM  Principle diagnosis: LOF ,   Plan:  Admit to L+D    IVF    iv pain meds  / CLE   ----- Huel Cote MD Attending Obstetrician and Gynecologist [facility], Department of OB/GYN [facility] Patient ID: Dana May, female   DOB: 1986-05-24, 33 y.o.   MRN: [id number]

## 2019-10-15 NOTE — Progress Notes (Signed)
Dana May is a 33 y.o. G1P0 at [redacted]w[redacted]d b Subjective: SROm on 20mu/min  Pitocin   Objective: BP (!) 117/58 (BP Location: Left Arm)   Pulse 79   Temp 98.3 F (36.8 C) (Oral)   Resp 16   Ht 5\' 7"  (1.702 m)   Wt 82.1 kg   LMP 01/14/2019 (Exact Date)   BMI 28.35 kg/m  No intake/output data recorded. No intake/output data recorded.  FHT:  FHR: 130 bpm, variability: moderate,  accelerations:  Present,  decelerations:  Absent UC:   q 2-3 minutes  SVE:   Dilation: Closed Effacement (%): 80 Station: -1 Exam by:: Oria Klimas MD  Labs: Lab Results  Component Value Date   WBC 13.5 (H) 10/15/2019   HGB 12.5 10/15/2019   HCT 35.1 (L) 10/15/2019   MCV 95.9 10/15/2019   PLT 215 10/15/2019    Assessment / Plan: early latent labor with SROM . On pitocin  Reassuring fetal monitoring  Dana May 10/15/2019, 9:20 PM

## 2019-10-15 NOTE — H&P (Signed)
Dana May is a 33 y.o. female presenting for LOF since 2400 last pm .  + ROM plus on exam . EGA 39+1 weeks  OB History    Gravida  1   Para      Term      Preterm      AB      Living        SAB      TAB      Ectopic      Multiple      Live Births             History reviewed. No pertinent past medical history. Past Surgical History:  Procedure Laterality Date  . WISDOM TOOTH EXTRACTION     Family History: family history is not on file. Social History:  reports that she has been smoking. She has never used smokeless tobacco. She reports that she does not drink alcohol or use drugs.     Maternal Diabetes: No Genetic Screening: Normal Maternal Ultrasounds/Referrals: Normal Fetal Ultrasounds or other Referrals:  None Maternal Substance Abuse:  No Significant Maternal Medications:  None Significant Maternal Lab Results:  Group B Strep negative Other Comments:  None  ROS History Dilation: Closed Effacement (%): 80 Station: -1 Exam by:: Tequita Marrs MD Blood pressure 122/77, pulse 74, temperature 98.1 F (36.7 C), temperature source Oral, resp. rate 16, height 5\' 7"  (1.702 m), weight 82.1 kg, last menstrual period 01/14/2019. Exam Physical Exam  Prenatal labs: ABO, Rh:  A+ Antibody:  neg Rubella:  imm Varicella Imm RPR:   neg HBsAg:   neg HIV:   neg GBS:   neg   Assessment/Plan: SROM  Admit   IVF  start Pitocin  IV stadol / cle once in active labor prn    Gwen Her Zonnie Landen 10/15/2019, 1:33 PM

## 2019-10-16 ENCOUNTER — Encounter: Payer: Self-pay | Admitting: Anesthesiology

## 2019-10-16 ENCOUNTER — Inpatient Hospital Stay: Payer: Medicaid Other | Admitting: Anesthesiology

## 2019-10-16 LAB — CBC
HCT: 30.5 % — ABNORMAL LOW (ref 36.0–46.0)
Hemoglobin: 10.6 g/dL — ABNORMAL LOW (ref 12.0–15.0)
MCH: 33.7 pg (ref 26.0–34.0)
MCHC: 34.8 g/dL (ref 30.0–36.0)
MCV: 96.8 fL (ref 80.0–100.0)
Platelets: 194 10*3/uL (ref 150–400)
RBC: 3.15 MIL/uL — ABNORMAL LOW (ref 3.87–5.11)
RDW: 14 % (ref 11.5–15.5)
WBC: 19.5 10*3/uL — ABNORMAL HIGH (ref 4.0–10.5)
nRBC: 0 % (ref 0.0–0.2)

## 2019-10-16 LAB — PREPARE RBC (CROSSMATCH)

## 2019-10-16 LAB — ABO/RH: ABO/RH(D): A POS

## 2019-10-16 LAB — RPR: RPR Ser Ql: NONREACTIVE

## 2019-10-16 MED ORDER — SODIUM CHLORIDE 0.9 % IV SOLN
INTRAVENOUS | Status: DC | PRN
  Administered 2019-10-16 (×2): 5 mL via EPIDURAL

## 2019-10-16 MED ORDER — CARBOPROST TROMETHAMINE 250 MCG/ML IM SOLN
INTRAMUSCULAR | Status: AC
  Administered 2019-10-16: 250 ug
  Filled 2019-10-16: qty 1

## 2019-10-16 MED ORDER — LACTATED RINGERS IV SOLN
500.0000 mL | Freq: Once | INTRAVENOUS | Status: AC
  Administered 2019-10-16: 500 mL via INTRAVENOUS

## 2019-10-16 MED ORDER — WITCH HAZEL-GLYCERIN EX PADS: 1.0000 "application " | MEDICATED_PAD | CUTANEOUS | Status: DC | PRN

## 2019-10-16 MED ORDER — IBUPROFEN 600 MG PO TABS
600.0000 mg | ORAL_TABLET | Freq: Four times a day (QID) | ORAL | Status: DC
  Administered 2019-10-16 – 2019-10-18 (×8): 600 mg via ORAL
  Filled 2019-10-16 (×7): qty 1

## 2019-10-16 MED ORDER — FERROUS SULFATE 325 (65 FE) MG PO TABS
325.0000 mg | ORAL_TABLET | Freq: Two times a day (BID) | ORAL | Status: DC
  Administered 2019-10-16 – 2019-10-18 (×4): 325 mg via ORAL
  Filled 2019-10-16 (×4): qty 1

## 2019-10-16 MED ORDER — OXYCODONE HCL 5 MG PO TABS: 10.0000 mg | ORAL_TABLET | ORAL | Status: DC | PRN

## 2019-10-16 MED ORDER — EPHEDRINE 5 MG/ML INJ
10.0000 mg | INTRAVENOUS | Status: DC | PRN
  Filled 2019-10-16: qty 2

## 2019-10-16 MED ORDER — MEASLES, MUMPS & RUBELLA VAC IJ SOLR
0.5000 mL | Freq: Once | INTRAMUSCULAR | Status: DC
  Filled 2019-10-16: qty 0.5

## 2019-10-16 MED ORDER — DIPHENHYDRAMINE HCL 50 MG/ML IJ SOLN: 12.5000 mg | INTRAMUSCULAR | Status: DC | PRN

## 2019-10-16 MED ORDER — BENZOCAINE-MENTHOL 20-0.5 % EX AERO
INHALATION_SPRAY | CUTANEOUS | Status: AC
  Filled 2019-10-16: qty 56

## 2019-10-16 MED ORDER — COCONUT OIL OIL
1.0000 "application " | TOPICAL_OIL | Status: DC | PRN
  Administered 2019-10-16 – 2019-10-18 (×2): 1 via TOPICAL
  Filled 2019-10-16 (×2): qty 120

## 2019-10-16 MED ORDER — FENTANYL 2.5 MCG/ML W/ROPIVACAINE 0.15% IN NS 100 ML EPIDURAL (ARMC)
12.0000 mL/h | EPIDURAL | Status: DC
  Administered 2019-10-16: 12 mL/h via EPIDURAL

## 2019-10-16 MED ORDER — LIDOCAINE-EPINEPHRINE (PF) 1.5 %-1:200000 IJ SOLN
INTRAMUSCULAR | Status: DC | PRN
  Administered 2019-10-16: 3 mL via EPIDURAL

## 2019-10-16 MED ORDER — PRENATAL MULTIVITAMIN CH
1.0000 | ORAL_TABLET | Freq: Every day | ORAL | Status: DC
  Administered 2019-10-17: 1 via ORAL
  Filled 2019-10-16: qty 1

## 2019-10-16 MED ORDER — ZOLPIDEM TARTRATE 5 MG PO TABS: 5.0000 mg | ORAL_TABLET | Freq: Every evening | ORAL | Status: DC | PRN

## 2019-10-16 MED ORDER — LIDOCAINE HCL (PF) 1 % IJ SOLN
INTRAMUSCULAR | Status: DC | PRN
  Administered 2019-10-16: 1 mL via INTRADERMAL

## 2019-10-16 MED ORDER — SENNOSIDES-DOCUSATE SODIUM 8.6-50 MG PO TABS
2.0000 | ORAL_TABLET | ORAL | Status: DC
  Administered 2019-10-17 – 2019-10-18 (×2): 2 via ORAL
  Filled 2019-10-16 (×2): qty 2

## 2019-10-16 MED ORDER — ONDANSETRON HCL 4 MG PO TABS: 4.0000 mg | ORAL_TABLET | ORAL | Status: DC | PRN

## 2019-10-16 MED ORDER — DIPHENHYDRAMINE HCL 25 MG PO CAPS: 25.0000 mg | ORAL_CAPSULE | Freq: Four times a day (QID) | ORAL | Status: DC | PRN

## 2019-10-16 MED ORDER — SODIUM CHLORIDE 0.9% IV SOLUTION
Freq: Once | INTRAVENOUS | Status: DC
  Administered 2019-10-16: 09:00:00 via INTRAVENOUS

## 2019-10-16 MED ORDER — IBUPROFEN 600 MG PO TABS
ORAL_TABLET | ORAL | Status: AC
  Filled 2019-10-16: qty 1

## 2019-10-16 MED ORDER — PHENYLEPHRINE 40 MCG/ML (10ML) SYRINGE FOR IV PUSH (FOR BLOOD PRESSURE SUPPORT)
80.0000 ug | PREFILLED_SYRINGE | INTRAVENOUS | Status: DC | PRN
  Filled 2019-10-16: qty 10

## 2019-10-16 MED ORDER — FENTANYL 2.5 MCG/ML W/ROPIVACAINE 0.15% IN NS 100 ML EPIDURAL (ARMC)
EPIDURAL | Status: AC
  Filled 2019-10-16: qty 100

## 2019-10-16 MED ORDER — SIMETHICONE 80 MG PO CHEW: 80.0000 mg | CHEWABLE_TABLET | ORAL | Status: DC | PRN

## 2019-10-16 MED ORDER — OXYTOCIN 10 UNIT/ML IJ SOLN
10.0000 [IU] | Freq: Once | INTRAMUSCULAR | Status: AC
  Administered 2019-10-16: 10 [IU] via INTRAMUSCULAR

## 2019-10-16 MED ORDER — DIBUCAINE (PERIANAL) 1 % EX OINT: 1.0000 "application " | TOPICAL_OINTMENT | CUTANEOUS | Status: DC | PRN

## 2019-10-16 MED ORDER — BENZOCAINE-MENTHOL 20-0.5 % EX AERO
1.0000 "application " | INHALATION_SPRAY | CUTANEOUS | Status: DC | PRN
  Administered 2019-10-16 – 2019-10-18 (×2): 1 via TOPICAL
  Filled 2019-10-16: qty 56

## 2019-10-16 MED ORDER — TRANEXAMIC ACID-NACL 1000-0.7 MG/100ML-% IV SOLN
1000.0000 mg | Freq: Once | INTRAVENOUS | Status: DC
  Administered 2019-10-16: 1000 mg via INTRAVENOUS
  Filled 2019-10-16: qty 100

## 2019-10-16 MED ORDER — METHYLERGONOVINE MALEATE 0.2 MG/ML IJ SOLN
INTRAMUSCULAR | Status: AC
  Administered 2019-10-16: 0.2 mg
  Filled 2019-10-16: qty 1

## 2019-10-16 MED ORDER — ONDANSETRON HCL 4 MG/2ML IJ SOLN: 4.0000 mg | INTRAMUSCULAR | Status: DC | PRN

## 2019-10-16 MED ORDER — OXYCODONE HCL 5 MG PO TABS: 5.0000 mg | ORAL_TABLET | ORAL | Status: DC | PRN

## 2019-10-16 MED ORDER — ACETAMINOPHEN 325 MG PO TABS
650.0000 mg | ORAL_TABLET | ORAL | Status: DC | PRN
  Administered 2019-10-17 – 2019-10-18 (×3): 650 mg via ORAL
  Filled 2019-10-16 (×3): qty 2

## 2019-10-16 MED ORDER — MAGNESIUM HYDROXIDE 400 MG/5ML PO SUSP
30.0000 mL | ORAL | Status: DC | PRN
  Filled 2019-10-16: qty 30

## 2019-10-16 MED ORDER — LACTATED RINGERS IV SOLN
INTRAVENOUS | Status: DC
  Administered 2019-10-16: 14:00:00 via INTRAVENOUS

## 2019-10-16 MED ORDER — MISOPROSTOL 200 MCG PO TABS
ORAL_TABLET | ORAL | Status: AC
  Filled 2019-10-16: qty 5

## 2019-10-16 NOTE — Anesthesia Procedure Notes (Signed)
Epidural Patient location during procedure: OB Start time: 10/16/2019 2:41 AM End time: 10/16/2019 2:45 AM  Staffing Anesthesiologist: Piscitello, Precious Haws, MD Performed: anesthesiologist   Preanesthetic Checklist Completed: patient identified, site marked, surgical consent, pre-op evaluation, timeout performed, IV checked, risks and benefits discussed and monitors and equipment checked  Epidural Patient position: sitting Prep: ChloraPrep Patient monitoring: heart rate, continuous pulse ox and blood pressure Approach: midline Location: L3-L4 Injection technique: LOR saline  Needle:  Needle type: Tuohy  Needle gauge: 17 G Needle length: 9 cm and 9 Needle insertion depth: 6 cm Catheter type: closed end flexible Catheter size: 19 Gauge Catheter at skin depth: 11 cm Test dose: negative and 1.5% lidocaine with Epi 1:200 K  Assessment Sensory level: T10 Events: blood not aspirated, injection not painful, no injection resistance, negative IV test and no paresthesia  Additional Notes 1 attempt Pt. Evaluated and documentation done after procedure finished. Patient identified. Risks/Benefits/Options discussed with patient including but not limited to bleeding, infection, nerve damage, paralysis, failed block, incomplete pain control, headache, blood pressure changes, nausea, vomiting, reactions to medication both or allergic, itching and postpartum back pain. Confirmed with bedside nurse the patient's most recent platelet count. Confirmed with patient that they are not currently taking any anticoagulation, have any bleeding history or any family history of bleeding disorders. Patient expressed understanding and wished to proceed. All questions were answered. Sterile technique was used throughout the entire procedure. Please see nursing notes for vital signs. Test dose was given through epidural catheter and negative prior to continuing to dose epidural or start infusion. Warning signs of  high block given to the patient including shortness of breath, tingling/numbness in hands, complete motor block, or any concerning symptoms with instructions to call for help. Patient was given instructions on fall risk and not to get out of bed. All questions and concerns addressed with instructions to call with any issues or inadequate analgesia.   Patient tolerated the insertion well without immediate complications.Reason for block:procedure for pain

## 2019-10-16 NOTE — Anesthesia Preprocedure Evaluation (Signed)
Anesthesia Evaluation  Patient identified by MRN, date of birth, ID band Patient awake    Reviewed: Allergy & Precautions, H&P , NPO status , Patient's Chart, lab work & pertinent test results  History of Anesthesia Complications Negative for: history of anesthetic complications  Airway Mallampati: III  TM Distance: >3 FB Neck ROM: full    Dental  (+) Chipped   Pulmonary Current Smoker and Patient abstained from smoking.,           Cardiovascular Exercise Tolerance: Good (-) hypertensionnegative cardio ROS       Neuro/Psych    GI/Hepatic negative GI ROS,   Endo/Other    Renal/GU   negative genitourinary   Musculoskeletal   Abdominal   Peds  Hematology negative hematology ROS (+)   Anesthesia Other Findings  Past Surgical History: No date: WISDOM TOOTH EXTRACTION  BMI    Body Mass Index: 28.35 kg/m      Reproductive/Obstetrics (+) Pregnancy                             Anesthesia Physical Anesthesia Plan  ASA: II  Anesthesia Plan: Epidural   Post-op Pain Management:    Induction:   PONV Risk Score and Plan:   Airway Management Planned:   Additional Equipment:   Intra-op Plan:   Post-operative Plan:   Informed Consent: I have reviewed the patients History and Physical, chart, labs and discussed the procedure including the risks, benefits and alternatives for the proposed anesthesia with the patient or authorized representative who has indicated his/her understanding and acceptance.       Plan Discussed with: Anesthesiologist  Anesthesia Plan Comments:         Anesthesia Quick Evaluation

## 2019-10-16 NOTE — Lactation Note (Signed)
This note was copied from a baby's chart. Lactation Consultation Note  Patient Name: Dana May Date: 10/16/2019   [facility] in to see mom and baby Dana May shortly after her 1350 feed. Transition RN reports mom may need assistance with positioning, latch, and breastfeeding basics due to being a first time mom. When asked, mom does note some nipple tenderness, but not uncomfortable. She feels her and baby are still trying to find their "groove" with breastfeeding, and has been offering breast continuously to baby.  LC provided brief education on early hunger cues, cues for need of comfort, position change, or diaper, encouraging skin to skin as much as possible reviewing benefits for both mom and baby. Discussed with mom signs of optimal latch, deep latch on tissue, flange lips, tummy turned in. Encouraged sandwich hold and patience for wide open mouth before latching. Use of coconut oil and expressed colostrum can aid in nipple soreness. Mom feels she is well educated on breastfeeding through reading books and attending a virtual breastfeeding class, she had no questions or concerns and understands that she can call out for breastfeeding assistance as needed.  Maternal Data    Feeding Feeding Type: Breast Fed  [facility] : [facility] Score                   Interventions    Lactation Tools Discussed/Used     Consult Status      Lavonia Drafts 10/16/2019, 2:54 PM

## 2019-10-16 NOTE — Discharge Summary (Signed)
Obstetrical Discharge Summary  Patient Name: Dana May DOB: 1986-08-28 MRN: [id number]  Date of Admission: 10/15/2019 Date of Delivery: 10/16/2019 Delivered by: Beverly Gust MD Date of Discharge: 10/18/2019  Primary OB: [facility]  [id number]'F last menstrual period was 01/14/2019 (exact date). EDC Estimated Date of Delivery: 10/21/19 Gestational Age at Delivery: [redacted]w[redacted]d   Antepartum complications: none Admitting Diagnosis:  Secondary Diagnosis: Patient Active Problem List   Diagnosis Date Noted  . Postpartum hemorrhage 10/16/2019  . Suspected rupture of membranes not found for normal first pregnancy 10/15/2019  . Premature rupture of membranes 10/15/2019  . Impingement syndrome of right shoulder 07/10/2012  . Pain in joint, shoulder region 07/10/2012  . Muscle weakness (generalized) 07/10/2012    Augmentation: Pitocin Complications: Hemorrhage>1077mL QBL 1740 cc Intrapartum complications/course: srom  And underwent pitocin augmentation for 17 hours . Delivery complicated by [facility] ( see delivery note) blood transfusion  Date of Delivery: 10/16/2019 Delivered By: Beverly Gust MD Delivery Type: spontaneous vaginal delivery Anesthesia: epidural Placenta: spontaneous Laceration: bilateral sulcus and bilateral labial  Episiotomy: none Newborn Data: Live born female  Birth Weight: 7 lb 4.4 oz (3300 g) APGAR: 8, 9   Newborn Delivery   Birth date/time: 10/16/2019 07:14:00 Delivery type: Vaginal, Spontaneous      Postpartum Procedures: none  Post partum course:  Patient had an uncomplicated postpartum course.  By time of discharge on PPD#2, her pain was controlled on oral pain medications; she had appropriate lochia and was ambulating, voiding without difficulty and tolerating regular diet.  She was deemed stable for discharge to home.    Discharge Physical Exam:  BP 115/72 (BP Location: Right Arm)   Pulse 79   Temp 98.3 F (36.8 C) (Oral)   Resp 20    Ht 5\' 7"  (1.702 m)   Wt 82.1 kg   LMP 01/14/2019 (Exact Date)   SpO2 100%   Breastfeeding Unknown   BMI 28.35 kg/m   General: NAD CV: RRR Pulm: CTABL, nl effort ABD: s/nd/nt, fundus firm and below the umbilicus Lochia: moderate DVT Evaluation: LE non-ttp, no evidence of DVT on exam.  Hemoglobin  Date Value Ref Range Status  10/18/2019 8.1 (L) 12.0 - 15.0 g/dL Final   HCT  Date Value Ref Range Status  10/18/2019 24.0 (L) 36.0 - 46.0 % Final     Disposition: stable, discharge to home. Baby Feeding: breastmilk & formula Baby Disposition: home with mom  Rh Immune globulin given: n/a Rubella vaccine given: n/a Tdap vaccine given in AP or PP setting: 08/12/2019 Flu vaccine given in AP or PP setting: 08/12/2019  Contraception: POPs  Prenatal Labs:   ABO, Rh:  A+ Antibody:  neg Rubella:  imm Varicella Imm RPR:   neg HBsAg:   neg HIV:   neg GBS:   neg   Plan:  [facility] [place] was discharged to home in good condition. Follow-up appointment with delivering provider in 6 weeks.  Discharge Medications: Allergies as of 10/18/2019      Reactions   Penicillins Hives, Swelling, Rash      Medication List    TAKE these medications   acetaminophen 325 MG tablet Commonly known as: Tylenol Take 2 tablets (650 mg total) by mouth every 4 (four) hours as needed for mild pain or moderate pain.   ascorbic acid 500 MG tablet Commonly known as: VITAMIN C Take 1 tablet (500 mg total) by mouth 2 (two) times daily with a meal.   ferrous sulfate 325 (65 FE) MG tablet  Take 1 tablet (325 mg total) by mouth 2 (two) times daily with a meal.   ibuprofen 600 MG tablet Commonly known as: ADVIL Take 1 tablet (600 mg total) by mouth every 6 (six) hours as needed for mild pain, moderate pain or cramping.   prenatal multivitamin Tabs tablet Take 1 tablet by mouth daily at 12 noon.       Follow-up [facility]. Schedule an appointment as soon as possible for a  visit in 6 week(s).   Why: postpartum follow up Contact information: [address] [place] [phone]          Signed: Lisette Grinder, CNM 10/18/2019 9:27 AM

## 2019-10-17 ENCOUNTER — Encounter: Payer: Self-pay | Admitting: Obstetrics & Gynecology

## 2019-10-17 LAB — BPAM RBC
Blood Product Expiration Date: 202012092359
Blood Product Expiration Date: 202012252359
ISSUE DATE / TIME: 202012030825
ISSUE DATE / TIME: 202012031239
Unit Type and Rh: 5100
Unit Type and Rh: 6200

## 2019-10-17 LAB — TYPE AND SCREEN
ABO/RH(D): A POS
Antibody Screen: NEGATIVE
Unit division: 0
Unit division: 0

## 2019-10-17 LAB — CBC
HCT: 23.5 % — ABNORMAL LOW (ref 36.0–46.0)
Hemoglobin: 8.3 g/dL — ABNORMAL LOW (ref 12.0–15.0)
MCH: 33.6 pg (ref 26.0–34.0)
MCHC: 35.3 g/dL (ref 30.0–36.0)
MCV: 95.1 fL (ref 80.0–100.0)
Platelets: 166 10*3/uL (ref 150–400)
RBC: 2.47 MIL/uL — ABNORMAL LOW (ref 3.87–5.11)
RDW: 14.2 % (ref 11.5–15.5)
WBC: 16.1 10*3/uL — ABNORMAL HIGH (ref 4.0–10.5)
nRBC: 0 % (ref 0.0–0.2)

## 2019-10-17 MED ORDER — VITAMIN C 500 MG PO TABS
500.0000 mg | ORAL_TABLET | Freq: Two times a day (BID) | ORAL | Status: DC
  Administered 2019-10-17 – 2019-10-18 (×2): 500 mg via ORAL
  Filled 2019-10-17 (×2): qty 1

## 2019-10-17 NOTE — Progress Notes (Signed)
Nurse encouraged pt to shower at midnight and when she got in the shower nurse noticed the cracking and redness around both nipples. Mom thought this was normal and stated she had no pain at all.  Mom had stated previously that feedings were going well and baby was eating around 20 minutes at each feed.  Nurse educated mom on importance of correct latch, positioning, and switching breasts each feed. Provided pt with coconut oil and cold packs. Encouraged use of a bra and watched a feeding after noticing this. Mom is doing all the correct things and baby is latching well. Mom stated she believes this all happened during the 1st feed right   Will continue to monitor.

## 2019-10-17 NOTE — Lactation Note (Signed)
This note was copied from a baby's chart. Lactation Consultation Note  Patient Name: Dana May [id number] Date: 10/17/2019 Reason for consult: Follow-up assessment;[place] Is staying tonight, no discharge  Maternal Data Formula Feeding for Exclusion: No Has patient been taught Hand Expression?: Yes Does the patient have breastfeeding experience prior to this delivery?: No  Feeding Feeding Type: Breast Fed Baby fussy at breast at first and had trouble coordinating latch, but once calmed latched easily LATCH Score Latch: Grasps breast easily, tongue down, lips flanged, rhythmical sucking.  Audible Swallowing: Spontaneous and intermittent  Type of Nipple: Everted at rest and after stimulation  Comfort (Breast/Nipple): Filling, red/small blisters or bruises, mild/mod discomfort(bruises around areola, no pain)  Hold (Positioning): No assistance needed to correctly position infant at breast.  LATCH Score: 9  Interventions Interventions: Skin to skin;Support pillows  Lactation Tools Discussed/Used WIC Program: Yes   Consult Status Consult Status: Follow-up Date: 10/18/19 Follow-up type: In-patient    Ferol Luz 10/17/2019, 6:02 PM

## 2019-10-17 NOTE — Anesthesia Postprocedure Evaluation (Signed)
Anesthesia Post Note  Patient: Dana May  Procedure(s) Performed: AN AD HOC LABOR EPIDURAL  Patient location during evaluation: Mother Baby Anesthesia Type: Epidural Level of consciousness: awake and alert Pain management: pain level controlled Vital Signs Assessment: post-procedure vital signs reviewed and stable Respiratory status: spontaneous breathing, nonlabored ventilation and respiratory function stable Cardiovascular status: stable Postop Assessment: no headache, no backache and epidural receding Anesthetic complications: no     Last Vitals:  Vitals:   10/17/19 0030 10/17/19 0501  BP: 108/72 100/65  Pulse: 84 92  Resp: 20 20  Temp: 36.7 C 36.9 C  SpO2: 98% 97%    Last Pain:  Vitals:   10/17/19 0600  TempSrc:   PainSc: 0-No pain                 Avina Eberle B Clarisa Kindred

## 2019-10-18 LAB — CBC
HCT: 24 % — ABNORMAL LOW (ref 36.0–46.0)
Hemoglobin: 8.1 g/dL — ABNORMAL LOW (ref 12.0–15.0)
MCH: 33.8 pg (ref 26.0–34.0)
MCHC: 33.8 g/dL (ref 30.0–36.0)
MCV: 100 fL (ref 80.0–100.0)
Platelets: 183 10*3/uL (ref 150–400)
RBC: 2.4 MIL/uL — ABNORMAL LOW (ref 3.87–5.11)
RDW: 14.1 % (ref 11.5–15.5)
WBC: 13.8 10*3/uL — ABNORMAL HIGH (ref 4.0–10.5)
nRBC: 0 % (ref 0.0–0.2)

## 2019-10-18 MED ORDER — ACETAMINOPHEN 325 MG PO TABS: 650.0000 mg | ORAL_TABLET | ORAL | Status: AC | PRN

## 2019-10-18 MED ORDER — IBUPROFEN 600 MG PO TABS: 600.0000 mg | ORAL_TABLET | Freq: Four times a day (QID) | ORAL | 0 refills | Status: AC | PRN

## 2019-10-18 MED ORDER — ASCORBIC ACID 500 MG PO TABS: 500.0000 mg | ORAL_TABLET | Freq: Two times a day (BID) | ORAL | 1 refills | Status: AC

## 2019-10-18 MED ORDER — FERROUS SULFATE 325 (65 FE) MG PO TABS: 325.0000 mg | ORAL_TABLET | Freq: Two times a day (BID) | ORAL | 1 refills | Status: AC

## 2019-10-18 MED ORDER — PRENATAL MULTIVITAMIN CH: 1.0000 | ORAL_TABLET | Freq: Every day | ORAL | Status: AC

## 2019-10-18 NOTE — Discharge Instructions (Signed)

## 2021-02-11 ENCOUNTER — Encounter: Payer: Self-pay | Admitting: Nurse Practitioner

## 2021-02-16 ENCOUNTER — Ambulatory Visit: Payer: Medicaid Other | Admitting: Nurse Practitioner

## 2021-02-16 ENCOUNTER — Other Ambulatory Visit: Payer: Self-pay

## 2021-02-16 ENCOUNTER — Encounter: Payer: Self-pay | Admitting: Nurse Practitioner

## 2021-02-16 VITALS — BP 99/71 | HR 89 | Temp 98.7°F | Ht 67.0 in | Wt 155.8 lb

## 2021-02-16 DIAGNOSIS — E538 Deficiency of other specified B group vitamins: Secondary | ICD-10-CM | POA: Diagnosis not present

## 2021-02-16 DIAGNOSIS — E559 Vitamin D deficiency, unspecified: Secondary | ICD-10-CM

## 2021-02-16 DIAGNOSIS — L659 Nonscarring hair loss, unspecified: Secondary | ICD-10-CM | POA: Insufficient documentation

## 2021-02-16 DIAGNOSIS — F320 Major depressive disorder, single episode, mild: Secondary | ICD-10-CM

## 2021-02-16 DIAGNOSIS — L219 Seborrheic dermatitis, unspecified: Secondary | ICD-10-CM | POA: Insufficient documentation

## 2021-02-16 DIAGNOSIS — Z7689 Persons encountering health services in other specified circumstances: Secondary | ICD-10-CM

## 2021-02-16 MED ORDER — TRIAMCINOLONE ACETONIDE 0.5 % EX OINT: 1.0000 "application " | TOPICAL_OINTMENT | Freq: Two times a day (BID) | CUTANEOUS | 0 refills | Status: AC

## 2021-02-16 MED ORDER — KETOCONAZOLE 2 % EX SHAM: 1.0000 "application " | MEDICATED_SHAMPOO | CUTANEOUS | 0 refills | Status: AC

## 2021-02-16 MED ORDER — TRIAMCINOLONE ACETONIDE 0.5 % EX OINT: 1.0000 "application " | TOPICAL_OINTMENT | Freq: Two times a day (BID) | CUTANEOUS | 0 refills | Status: DC

## 2021-02-16 MED ORDER — KETOCONAZOLE 2 % EX SHAM: 1.0000 "application " | MEDICATED_SHAMPOO | CUTANEOUS | 0 refills | Status: DC

## 2021-02-16 MED ORDER — CITALOPRAM HYDROBROMIDE 10 MG PO TABS: 10.0000 mg | ORAL_TABLET | Freq: Every day | ORAL | 4 refills | Status: AC

## 2021-02-16 NOTE — Assessment & Plan Note (Signed)
?  related to stressors and new infant.  Recheck TSH and CMP today.  Monitor closely and consider dermatology referral if ongoing.

## 2021-02-16 NOTE — Progress Notes (Signed)
New Patient Office Visit  Subjective:  Patient ID: Dana May, female    DOB: 07-01-1986  Age: 35 y.o. MRN: [id number]  CC:  Chief Complaint  Patient presents with  . Establish Care  . Rash    Patient states she is having rash breakouts all over her body and then drainage from rash(bumps) in the neck region. Patient complains of hair loss, flaky and itchy scalp.     HPI Dana May presents for new patient visit to establish care.  Introduced to Designer, jewellery role and practice setting.  All questions answered.  Discussed provider/patient relationship and expectations.  RASH Has been seen in urgent care for this, last 01/18/21.  On review notes has tried Benadryl and Zyrtec.  On 01/18/21 was given Prednisone and abx therapy (Bactrim).  Recent TSH in January was 2.399, B12 low at 250, Vit D 22.1.  ESR normal and CBC, CMP.  Was taking supplements for B12 and D, but no longer.    Had IUD placed January 24th, week before placement check she started to break out in rash on head and scalp + eye irritation.  Had IUD checked and then had hives.  Had IUD removed 15th of March -- the rash has improved some, but the back of scalp and head rash continue to flare.  Does endorse some hair loss as well, more noted post partum -- delivered December 3rd.   Duration:  months  Location: head and scalp  Itching: yes Burning: yes Redness: yes Oozing: yes Scaling: yes Blisters: no Painful: no Fevers: no Change in detergents/soaps/personal care products: no Recent illness: no Recent travel:no History of same: no Context: fluctuating Alleviating factors: Tea Tree soap and apple cider vinegar rinse Treatments attempted:Prednisone, abx, and IUD removal Shortness of breath: no  Throat/tongue swelling: no Myalgias/arthralgias: no   ANXIETY/STRESS Currently on Buspar 5 MG daily -- sometimes takes twice a day, ordered by OB/GYN.  Delivered her first child on December 3rd, 2021.    Duration:uncontrolled Anxious mood: yes  Excessive worrying: yes Irritability: no  Sweating: no Nausea: no Palpitations:no Hyperventilation: no Panic attacks: no Agoraphobia: no  Obscessions/compulsions: no Depressed mood: yes Depression screen [facility] 2/9 02/16/2021  Decreased Interest 0  Down, Depressed, Hopeless 1  PHQ - 2 Score 1  Altered sleeping 0  Tired, decreased energy 3  Change in appetite 1  Feeling bad or failure about yourself  1  Trouble concentrating 1  Moving slowly or fidgety/restless 0  Suicidal thoughts 0  PHQ-9 Score 7   Anhedonia: no Weight changes: no Insomnia: none Hypersomnia: no Fatigue/loss of energy: yes Feelings of worthlessness: no Feelings of guilt: yes Impaired concentration/indecisiveness: yes Suicidal ideations: no  Crying spells: occasional Recent Stressors/Life Changes: yes   Relationship problems: no   Family stress: new mother     Financial stress: no    Job stress: no    Recent death/loss: no GAD 7 : Generalized Anxiety Score 02/16/2021  Nervous, Anxious, on Edge 2  Control/stop worrying 2  Worry too much - different things 3  Trouble relaxing 3  Restless 2  Easily annoyed or irritable 1  Afraid - awful might happen 0  Total GAD 7 Score 13  Anxiety Difficulty Somewhat difficult     Past Medical History:  Diagnosis Date  . Anxiety   . Heart murmur     Past Surgical History:  Procedure Laterality Date  . WISDOM TOOTH EXTRACTION      Family History  Problem  Relation Age of Onset  . Healthy Mother   . Cardiomyopathy Father     Social History   Socioeconomic History  . Marital status: Single    Spouse name: Not on file  . Number of children: Not on file  . Years of education: Not on file  . Highest education level: Not on file  Occupational History  . Not on file  Tobacco Use  . Smoking status: Current Every Day Smoker  . Smokeless tobacco: Never Used  Substance and Sexual Activity  . Alcohol use: No  .  Drug use: No  . Sexual activity: Yes    Birth control/protection: Pill  Other Topics Concern  . Not on file  Social History Narrative  . Not on file   Social Determinants of Health   Financial Resource Strain: Low Risk   . Difficulty of Paying Living Expenses: Not hard at all  Food Insecurity: No Food Insecurity  . Worried About Charity fundraiser in the Last Year: Never true  . Ran Out of Food in the Last Year: Never true  Transportation Needs: No Transportation Needs  . Lack of Transportation (Medical): No  . Lack of Transportation (Non-Medical): No  Physical Activity: Sufficiently Active  . Days of Exercise per Week: 7 days  . Minutes of Exercise per Session: 30 min  Stress: Stress Concern Present  . Feeling of Stress : To some extent  Social Connections: Moderately Isolated  . Frequency of Communication with Friends and Family: Twice a week  . Frequency of Social Gatherings with Friends and Family: Twice a week  . Attends Religious Services: Never  . Active Member of Clubs or Organizations: No  . Attends Archivist Meetings: Never  . Marital Status: Living with partner  Intimate Partner Violence: Not on file    ROS Review of Systems  Constitutional: Negative for activity change, appetite change, diaphoresis, fatigue and fever.  Respiratory: Negative for cough, chest tightness and shortness of breath.   Cardiovascular: Negative for chest pain, palpitations and leg swelling.  Gastrointestinal: Negative.   Skin: Positive for rash.  Neurological: Negative.   Psychiatric/Behavioral: Positive for decreased concentration. Negative for self-injury, sleep disturbance and suicidal ideas. The patient is nervous/anxious.     Objective:   Today's Vitals: BP 99/71   Pulse 89   Temp 98.7 F (37.1 C) (Oral)   Ht _0  (1.702 m)   Wt 155 lb 12.8 oz (70.7 kg)   SpO2 98%   BMI 24.40 kg/m   Physical Exam Vitals and nursing note reviewed.  Constitutional:       General: She is awake. She is not in acute distress.    Appearance: She is well-developed and well-groomed. She is not ill-appearing or toxic-appearing.  HENT:     Head: Normocephalic.     Right Ear: Hearing normal.     Left Ear: Hearing normal.  Eyes:     General: Lids are normal.        Right eye: No discharge.        Left eye: No discharge.     Conjunctiva/sclera: Conjunctivae normal.     Pupils: Pupils are equal, round, and reactive to light.  Neck:     Thyroid: No thyromegaly.     Vascular: No carotid bruit.  Cardiovascular:     Rate and Rhythm: Normal rate and regular rhythm.     Heart sounds: Normal heart sounds. No murmur heard. No gallop.   Pulmonary:  Effort: Pulmonary effort is normal. No accessory muscle usage or respiratory distress.     Breath sounds: Normal breath sounds.  Abdominal:     General: Bowel sounds are normal.     Palpations: Abdomen is soft. There is no hepatomegaly or splenomegaly.  Musculoskeletal:     Cervical back: Normal range of motion and neck supple.     Right lower leg: No edema.     Left lower leg: No edema.  Lymphadenopathy:     Cervical: No cervical adenopathy.  Skin:    General: Skin is warm and dry.       Neurological:     Mental Status: She is alert and oriented to person, place, and time.     Deep Tendon Reflexes: Reflexes are normal and symmetric.     Reflex Scores:      Brachioradialis reflexes are 2+ on the right side and 2+ on the left side.      Patellar reflexes are 2+ on the right side and 2+ on the left side. Psychiatric:        Attention and Perception: Attention normal.        Mood and Affect: Mood normal.        Speech: Speech normal.        Behavior: Behavior normal. Behavior is cooperative.        Thought Content: Thought content normal.     Assessment & Plan:   Problem List Items Addressed This Visit      Musculoskeletal and Integument   Seborrheic dermatitis    Ongoing issue since January to posterior  scalp remaining, rest of rash area improved post IUD removal.  At this time presents and appears like seborrheic dermatitis scalp. Will treat with Ketoconazole shampoo and Triamcinolone ointment, insurance not covering spray or foam.  Recommend to dry scalp well after washing. Plan to return to office in 4 weeks, if ongoing refer to dermatology.  Patient agrees with this plan.      Relevant Orders   TSH   Comprehensive metabolic panel     Other   Depression    Chronic, ongoing with anxiety element.  At this time will start Celexa 10 MG daily, script sent, and continue Buspar 5 MG daily -- this addition may benefit the anxiety element further.  Denies SI/HI.  Return to office in 4 weeks for follow-up.      Relevant Medications   busPIRone (BUSPAR) 5 MG tablet   citalopram (CELEXA) 10 MG tablet   B12 deficiency    Noted on past labs and currently not taking supplement. Check B12 level today and restart supplement as needed.      Relevant Orders   Vitamin B12   Vitamin D deficiency    Noted on past labs and currently not taking supplement. Check Vit D level today and restart supplement as needed.      Relevant Orders   VITAMIN D 25 Hydroxy (Vit-D Deficiency, Fractures)   Hair loss    ?related to stressors and new infant.  Recheck TSH and CMP today.  Monitor closely and consider dermatology referral if ongoing.       Other Visit Diagnoses    Encounter to establish care    -  Primary      Outpatient Encounter Medications as of 02/16/2021  Medication Sig  . acetaminophen (TYLENOL) 325 MG tablet Take 2 tablets (650 mg total) by mouth every 4 (four) hours as needed for mild pain or moderate pain.  Marland Kitchen  busPIRone (BUSPAR) 5 MG tablet Take by mouth.  . citalopram (CELEXA) 10 MG tablet Take 1 tablet (10 mg total) by mouth daily.  . ferrous sulfate 325 (65 FE) MG tablet Take 1 tablet (325 mg total) by mouth 2 (two) times daily with a meal.  . ibuprofen (ADVIL) 600 MG tablet Take 1 tablet  (600 mg total) by mouth every 6 (six) hours as needed for mild pain, moderate pain or cramping.  . Norethindrone Acetate-Ethinyl Estrad-FE (LOESTRIN 24 FE) 1-20 MG-MCG(24) tablet Take 1 tablet by mouth daily.  . Prenatal Vit-Fe Fumarate-FA (PRENATAL MULTIVITAMIN) TABS tablet Take 1 tablet by mouth daily at 12 noon.  . vitamin C (VITAMIN C) 500 MG tablet Take 1 tablet (500 mg total) by mouth 2 (two) times daily with a meal.  . [DISCONTINUED] ketoconazole (NIZORAL) 2 % shampoo Apply 1 application topically 2 (two) times a week.  . [DISCONTINUED] triamcinolone ointment (KENALOG) 0.5 % Apply 1 application topically 2 (two) times daily.  Derrill Memo ON 02/17/2021] ketoconazole (NIZORAL) 2 % shampoo Apply 1 application topically 2 (two) times a week.  . triamcinolone ointment (KENALOG) 0.5 % Apply 1 application topically 2 (two) times daily.   No facility-administered encounter medications on file as of 02/16/2021.    Follow-up: Return in about 4 weeks (around 03/16/2021) for Anxiety and rash.   Venita Lick, NP

## 2021-02-16 NOTE — Patient Instructions (Signed)
Seborrheic Dermatitis, Adult Seborrheic dermatitis is a skin disease that causes red, scaly patches. It usually occurs on the scalp, and it is often called dandruff. The patches may appear on other parts of the body. Skin patches tend to appear where there are many oil glands in the skin. Areas of the body that are commonly affected include the:  Scalp.  Ears.  Eyebrows.  Face.  Bearded area of men's faces.  Skin folds of the body, such as the armpits, groin, and buttocks.  Chest. The condition may come and go for no known reason, and it is often long-lasting (chronic). What are the causes? The cause of this condition is not known. What increases the risk? The following factors may make you more likely to develop this condition:  Having certain conditions, such as: ? HIV (human immunodeficiency virus). ? AIDS (acquired immunodeficiency syndrome). ? Parkinson's disease. ? Mood disorders, such as depression.  Being 40-60 years old. What are the signs or symptoms? Symptoms of this condition include:  Thick scales on the scalp.  Redness on the face or in the armpits.  Skin that is flaky. The flakes may be white or yellow.  Skin that seems oily or dry but is not helped with moisturizers.  Itching or burning in the affected areas.   How is this diagnosed? This condition is diagnosed with a medical history and physical exam. A sample of your skin may be tested (skin biopsy). You may need to see a skin specialist (dermatologist). How is this treated? There is no cure for this condition, but treatment can help to manage the symptoms. You may get treatment to remove scales, lower the risk of skin infection, and reduce swelling or itching. Treatment may include:  Creams that reduce skin yeast.  Medicated shampoo.  Moisturizing creams or ointments.  Creams that reduce swelling and irritation (steroids). Follow these instructions at home:  Apply over-the-counter and  prescription medicines only as told by your health care provider.  Use any medicated shampoo, skin creams, or ointments only as told by your health care provider.  Keep all follow-up visits as told by your health care provider. This is important. Contact a health care provider if:  Your symptoms do not improve with treatment.  Your symptoms get worse.  You have new symptoms. Get help right away if:  Your condition rapidly worsens with treatment. Summary  Seborrheic dermatitis is a skin disease that causes red, scaly patches.  Seborrheic dermatitis commonly affects the scalp, face, and skin folds.  There is no cure for this condition, but treatment can help to manage the symptoms. This information is not intended to replace advice given to you by your health care provider. Make sure you discuss any questions you have with your health care provider. Document Revised: 08/07/2019 Document Reviewed: 08/07/2019 Elsevier Patient Education  2021 [employer]  

## 2021-02-16 NOTE — Assessment & Plan Note (Signed)
Noted on past labs and currently not taking supplement. Check B12 level today and restart supplement as needed.

## 2021-02-16 NOTE — Assessment & Plan Note (Signed)
Noted on past labs and currently not taking supplement. Check Vit D level today and restart supplement as needed.

## 2021-02-16 NOTE — Assessment & Plan Note (Signed)
Chronic, ongoing with anxiety element.  At this time will start Celexa 10 MG daily, script sent, and continue Buspar 5 MG daily -- this addition may benefit the anxiety element further.  Denies SI/HI.  Return to office in 4 weeks for follow-up.

## 2021-02-16 NOTE — Assessment & Plan Note (Signed)
Ongoing issue since January to posterior scalp remaining, rest of rash area improved post IUD removal.  At this time presents and appears like seborrheic dermatitis scalp. Will treat with Ketoconazole shampoo and Triamcinolone ointment, insurance not covering spray or foam.  Recommend to dry scalp well after washing. Plan to return to office in 4 weeks, if ongoing refer to dermatology.  Patient agrees with this plan.

## 2021-02-17 LAB — COMPREHENSIVE METABOLIC PANEL
ALT: 9 IU/L (ref 0–32)
AST: 15 IU/L (ref 0–40)
Albumin/Globulin Ratio: 1.5 (ref 1.2–2.2)
Albumin: 4.1 g/dL (ref 3.8–4.8)
Alkaline Phosphatase: 71 IU/L (ref 44–121)
BUN/Creatinine Ratio: 12 (ref 9–23)
BUN: 9 mg/dL (ref 6–20)
Bilirubin Total: <0.2 mg/dL (ref 0.0–1.2)
CO2: 20 mmol/L (ref 20–29)
Calcium: 8.9 mg/dL (ref 8.7–10.2)
Chloride: 103 mmol/L (ref 96–106)
Creatinine, Ser: 0.77 mg/dL (ref 0.57–1.00)
Globulin, Total: 2.7 g/dL (ref 1.5–4.5)
Glucose: 71 mg/dL (ref 65–99)
Potassium: 4.5 mmol/L (ref 3.5–5.2)
Sodium: 137 mmol/L (ref 134–144)
Total Protein: 6.8 g/dL (ref 6.0–8.5)
eGFR: 104 mL/min/{1.73_m2} (ref >59)

## 2021-02-17 LAB — VITAMIN B12: Vitamin B-12: 297 pg/mL (ref 232–1245)

## 2021-02-17 LAB — TSH: TSH: 1.63 u[IU]/mL (ref 0.450–4.500)

## 2021-02-17 LAB — VITAMIN D 25 HYDROXY (VIT D DEFICIENCY, FRACTURES): Vit D, 25-Hydroxy: 24.1 ng/mL — ABNORMAL LOW (ref 30.0–100.0)

## 2021-02-17 NOTE — Progress Notes (Signed)
Good afternoon, please let Dana May know her labs have returned and are overall normal with exception of Vitamin D and Vitamin B12 levels remaining on lower side.  I do recommend she take Vitamin D3 2000 units daily and Vitamin B12 1000 mcg daily.  These are good supplements for bone and nervous system health.  We will recheck levels next visit if she takes these consistently.  Any questions? Keep being awesome!!  Thank you for allowing me to participate in your care. Kindest regards, Jalana Moore

## 2021-03-18 ENCOUNTER — Ambulatory Visit: Payer: Medicaid Other | Admitting: Nurse Practitioner
